# Patient Record
Sex: Female | Born: 1935 | Race: White | Hispanic: No | Marital: Married | Smoking: Never smoker
Health system: Southern US, Community
[De-identification: ages and names within clinical notes are randomized; demographics above are authoritative.]

## PROBLEM LIST (undated history)

## (undated) DIAGNOSIS — I10 Essential (primary) hypertension: Secondary | ICD-10-CM

## (undated) DIAGNOSIS — Z7189 Other specified counseling: Secondary | ICD-10-CM

## (undated) DIAGNOSIS — N189 Chronic kidney disease, unspecified: Secondary | ICD-10-CM

## (undated) HISTORY — PX: TONSILLECTOMY: SUR1361

## (undated) HISTORY — PX: HERNIA REPAIR: SHX51

## (undated) HISTORY — PX: ABDOMINAL HYSTERECTOMY: SHX81

## (undated) HISTORY — PX: APPENDECTOMY: SHX54

## (undated) HISTORY — PX: OTHER SURGICAL HISTORY: SHX169

---

## 2006-07-13 ENCOUNTER — Encounter (INDEPENDENT_AMBULATORY_CARE_PROVIDER_SITE_OTHER): Payer: Self-pay | Admitting: Emergency Medicine

## 2006-07-13 ENCOUNTER — Encounter (INDEPENDENT_AMBULATORY_CARE_PROVIDER_SITE_OTHER): Payer: Self-pay | Admitting: Hospitalist

## 2006-07-13 ENCOUNTER — Ambulatory Visit: Payer: Self-pay | Admitting: Cardiology

## 2006-07-13 ENCOUNTER — Inpatient Hospital Stay (HOSPITAL_COMMUNITY): Admission: EM | Admit: 2006-07-13 | Discharge: 2006-07-28 | Payer: Self-pay | Admitting: Emergency Medicine

## 2006-07-13 ENCOUNTER — Ambulatory Visit: Payer: Self-pay | Admitting: Hospitalist

## 2006-07-14 ENCOUNTER — Ambulatory Visit: Payer: Self-pay | Admitting: Vascular Surgery

## 2006-07-14 ENCOUNTER — Encounter (INDEPENDENT_AMBULATORY_CARE_PROVIDER_SITE_OTHER): Payer: Self-pay | Admitting: Hospitalist

## 2006-07-24 ENCOUNTER — Ambulatory Visit: Payer: Self-pay | Admitting: Physical Medicine & Rehabilitation

## 2006-07-28 ENCOUNTER — Ambulatory Visit: Payer: Self-pay | Admitting: Physical Medicine & Rehabilitation

## 2006-07-28 ENCOUNTER — Inpatient Hospital Stay (HOSPITAL_COMMUNITY)
Admission: RE | Admit: 2006-07-28 | Discharge: 2006-08-02 | Payer: Self-pay | Admitting: Physical Medicine & Rehabilitation

## 2006-08-02 ENCOUNTER — Inpatient Hospital Stay (HOSPITAL_COMMUNITY): Admission: AD | Admit: 2006-08-02 | Discharge: 2006-08-12 | Payer: Self-pay | Admitting: Internal Medicine

## 2006-08-02 ENCOUNTER — Ambulatory Visit: Payer: Self-pay | Admitting: Internal Medicine

## 2006-08-07 ENCOUNTER — Ambulatory Visit: Payer: Self-pay | Admitting: Infectious Diseases

## 2006-08-08 ENCOUNTER — Encounter (INDEPENDENT_AMBULATORY_CARE_PROVIDER_SITE_OTHER): Payer: Self-pay | Admitting: Internal Medicine

## 2006-08-14 ENCOUNTER — Telehealth (INDEPENDENT_AMBULATORY_CARE_PROVIDER_SITE_OTHER): Payer: Self-pay | Admitting: Internal Medicine

## 2006-08-15 ENCOUNTER — Telehealth (INDEPENDENT_AMBULATORY_CARE_PROVIDER_SITE_OTHER): Payer: Self-pay | Admitting: *Deleted

## 2006-08-28 ENCOUNTER — Ambulatory Visit: Payer: Self-pay | Admitting: *Deleted

## 2006-08-28 ENCOUNTER — Encounter (INDEPENDENT_AMBULATORY_CARE_PROVIDER_SITE_OTHER): Payer: Self-pay | Admitting: *Deleted

## 2006-08-28 DIAGNOSIS — N8111 Cystocele, midline: Secondary | ICD-10-CM

## 2006-08-28 DIAGNOSIS — R5381 Other malaise: Secondary | ICD-10-CM | POA: Insufficient documentation

## 2006-08-28 DIAGNOSIS — L299 Pruritus, unspecified: Secondary | ICD-10-CM | POA: Insufficient documentation

## 2006-08-28 DIAGNOSIS — M545 Low back pain: Secondary | ICD-10-CM

## 2006-08-28 DIAGNOSIS — N189 Chronic kidney disease, unspecified: Secondary | ICD-10-CM

## 2006-08-28 DIAGNOSIS — D631 Anemia in chronic kidney disease: Secondary | ICD-10-CM | POA: Insufficient documentation

## 2006-08-28 DIAGNOSIS — N139 Obstructive and reflux uropathy, unspecified: Secondary | ICD-10-CM | POA: Insufficient documentation

## 2006-08-28 DIAGNOSIS — N19 Unspecified kidney failure: Secondary | ICD-10-CM | POA: Insufficient documentation

## 2006-08-28 DIAGNOSIS — R197 Diarrhea, unspecified: Secondary | ICD-10-CM | POA: Insufficient documentation

## 2006-08-28 DIAGNOSIS — N1330 Unspecified hydronephrosis: Secondary | ICD-10-CM | POA: Insufficient documentation

## 2006-08-28 DIAGNOSIS — N179 Acute kidney failure, unspecified: Secondary | ICD-10-CM | POA: Insufficient documentation

## 2006-08-28 DIAGNOSIS — E162 Hypoglycemia, unspecified: Secondary | ICD-10-CM

## 2006-08-28 DIAGNOSIS — E875 Hyperkalemia: Secondary | ICD-10-CM

## 2006-08-28 LAB — CONVERTED CEMR LAB
AST: 19 units/L (ref 0–37)
Albumin: 3.5 g/dL (ref 3.5–5.2)
Alkaline Phosphatase: 114 units/L (ref 39–117)
BUN: 58 mg/dL — ABNORMAL HIGH (ref 6–23)
Basophils Absolute: 0 10*3/uL (ref 0.0–0.1)
Basophils Relative: 0 % (ref 0–1)
CO2: 20 meq/L (ref 19–32)
Calcium: 8.8 mg/dL (ref 8.4–10.5)
Chloride: 112 meq/L (ref 96–112)
Creatinine, Ser: 3.09 mg/dL — ABNORMAL HIGH (ref 0.40–1.20)
Eosinophils Relative: 3 % (ref 0–5)
Glucose, Bld: 99 mg/dL (ref 70–99)
Hemoglobin: 12.4 g/dL (ref 12.0–15.0)
Lymphocytes Relative: 20 % (ref 12–46)
MCHC: 32.2 g/dL (ref 30.0–36.0)
Monocytes Absolute: 0.7 10*3/uL (ref 0.2–0.7)
Neutro Abs: 8.1 10*3/uL — ABNORMAL HIGH (ref 1.7–7.7)
Platelets: 298 10*3/uL (ref 150–400)
RDW: 16.4 % — ABNORMAL HIGH (ref 11.5–14.0)
Total Bilirubin: 0.4 mg/dL (ref 0.3–1.2)

## 2006-09-06 ENCOUNTER — Encounter (INDEPENDENT_AMBULATORY_CARE_PROVIDER_SITE_OTHER): Payer: Self-pay | Admitting: *Deleted

## 2006-09-07 ENCOUNTER — Telehealth (INDEPENDENT_AMBULATORY_CARE_PROVIDER_SITE_OTHER): Payer: Self-pay | Admitting: *Deleted

## 2006-09-11 ENCOUNTER — Encounter (INDEPENDENT_AMBULATORY_CARE_PROVIDER_SITE_OTHER): Payer: Self-pay | Admitting: *Deleted

## 2006-09-29 ENCOUNTER — Telehealth: Payer: Self-pay | Admitting: *Deleted

## 2006-10-06 ENCOUNTER — Encounter (INDEPENDENT_AMBULATORY_CARE_PROVIDER_SITE_OTHER): Payer: Self-pay | Admitting: *Deleted

## 2006-10-16 ENCOUNTER — Encounter (HOSPITAL_COMMUNITY): Admission: RE | Admit: 2006-10-16 | Discharge: 2007-01-14 | Payer: Self-pay | Admitting: *Deleted

## 2006-11-07 ENCOUNTER — Telehealth: Payer: Self-pay | Admitting: *Deleted

## 2006-11-07 ENCOUNTER — Ambulatory Visit: Payer: Self-pay | Admitting: Hospitalist

## 2006-11-20 ENCOUNTER — Encounter (INDEPENDENT_AMBULATORY_CARE_PROVIDER_SITE_OTHER): Payer: Self-pay | Admitting: *Deleted

## 2006-11-22 ENCOUNTER — Encounter (INDEPENDENT_AMBULATORY_CARE_PROVIDER_SITE_OTHER): Payer: Self-pay | Admitting: *Deleted

## 2006-11-27 ENCOUNTER — Encounter: Payer: Self-pay | Admitting: Family Medicine

## 2006-11-29 ENCOUNTER — Encounter (INDEPENDENT_AMBULATORY_CARE_PROVIDER_SITE_OTHER): Payer: Self-pay | Admitting: *Deleted

## 2006-11-29 ENCOUNTER — Ambulatory Visit: Payer: Self-pay | Admitting: *Deleted

## 2006-11-29 ENCOUNTER — Ambulatory Visit (HOSPITAL_COMMUNITY): Admission: RE | Admit: 2006-11-29 | Discharge: 2006-11-29 | Payer: Self-pay | Admitting: *Deleted

## 2006-11-29 DIAGNOSIS — R300 Dysuria: Secondary | ICD-10-CM

## 2006-11-30 ENCOUNTER — Encounter (INDEPENDENT_AMBULATORY_CARE_PROVIDER_SITE_OTHER): Payer: Self-pay | Admitting: *Deleted

## 2006-11-30 DIAGNOSIS — N390 Urinary tract infection, site not specified: Secondary | ICD-10-CM | POA: Insufficient documentation

## 2006-11-30 DIAGNOSIS — N2581 Secondary hyperparathyroidism of renal origin: Secondary | ICD-10-CM | POA: Insufficient documentation

## 2006-11-30 LAB — CONVERTED CEMR LAB
Albumin: 4.5 g/dL (ref 3.5–5.2)
BUN: 41 mg/dL — ABNORMAL HIGH (ref 6–23)
Bilirubin Urine: NEGATIVE
CO2: 19 meq/L (ref 19–32)
Calcium, Total (PTH): 9.3 mg/dL (ref 8.4–10.5)
Calcium: 9.2 mg/dL (ref 8.4–10.5)
Creatinine, Ser: 2.56 mg/dL — ABNORMAL HIGH (ref 0.40–1.20)
Eosinophils Absolute: 0.1 10*3/uL (ref 0.0–0.7)
Eosinophils Relative: 2 % (ref 0–5)
Glucose, Bld: 90 mg/dL (ref 70–99)
HCT: 58.4 % — ABNORMAL HIGH (ref 36.0–46.0)
Hemoglobin: 17.8 g/dL — ABNORMAL HIGH (ref 12.0–15.0)
Ketones, ur: NEGATIVE mg/dL
Lymphocytes Relative: 24 % (ref 12–46)
Lymphs Abs: 1.8 10*3/uL (ref 0.7–3.3)
MCV: 99.7 fL (ref 78.0–100.0)
Monocytes Absolute: 0.6 10*3/uL (ref 0.2–0.7)
PTH: 181.6 pg/mL — ABNORMAL HIGH (ref 14.0–72.0)
Protein, ur: 30 mg/dL — AB
Urine Glucose: NEGATIVE mg/dL
WBC: 7.4 10*3/uL (ref 4.0–10.5)

## 2007-02-27 ENCOUNTER — Encounter (INDEPENDENT_AMBULATORY_CARE_PROVIDER_SITE_OTHER): Payer: Self-pay | Admitting: *Deleted

## 2007-06-28 ENCOUNTER — Ambulatory Visit: Payer: Self-pay | Admitting: *Deleted

## 2007-06-28 ENCOUNTER — Encounter (INDEPENDENT_AMBULATORY_CARE_PROVIDER_SITE_OTHER): Payer: Self-pay | Admitting: *Deleted

## 2007-06-28 DIAGNOSIS — R21 Rash and other nonspecific skin eruption: Secondary | ICD-10-CM | POA: Insufficient documentation

## 2007-06-28 LAB — CONVERTED CEMR LAB
BUN: 63 mg/dL — ABNORMAL HIGH (ref 6–23)
Calcium: 9.2 mg/dL (ref 8.4–10.5)
Phosphorus: 4.5 mg/dL (ref 2.3–4.6)
Potassium: 4.4 meq/L (ref 3.5–5.3)
Sodium: 141 meq/L (ref 135–145)

## 2007-10-11 ENCOUNTER — Encounter (INDEPENDENT_AMBULATORY_CARE_PROVIDER_SITE_OTHER): Payer: Self-pay | Admitting: *Deleted

## 2007-10-11 ENCOUNTER — Ambulatory Visit: Payer: Self-pay | Admitting: Internal Medicine

## 2007-10-11 DIAGNOSIS — R030 Elevated blood-pressure reading, without diagnosis of hypertension: Secondary | ICD-10-CM

## 2007-10-16 LAB — CONVERTED CEMR LAB
BUN: 61 mg/dL — ABNORMAL HIGH (ref 6–23)
CO2: 20 meq/L (ref 19–32)
Calcium: 9.7 mg/dL (ref 8.4–10.5)
Chloride: 107 meq/L (ref 96–112)
Creatinine, Ser: 2.62 mg/dL — ABNORMAL HIGH (ref 0.40–1.20)
Glucose, Bld: 88 mg/dL (ref 70–99)
HCT: 38.5 % (ref 36.0–46.0)
Hemoglobin: 12.1 g/dL (ref 12.0–15.0)
MCV: 97.2 fL (ref 78.0–100.0)
Platelets: 202 10*3/uL (ref 150–400)
RBC: 3.96 M/uL (ref 3.87–5.11)
WBC: 8.3 10*3/uL (ref 4.0–10.5)

## 2007-10-19 ENCOUNTER — Encounter (INDEPENDENT_AMBULATORY_CARE_PROVIDER_SITE_OTHER): Payer: Self-pay | Admitting: *Deleted

## 2007-10-19 ENCOUNTER — Ambulatory Visit: Payer: Self-pay | Admitting: Internal Medicine

## 2007-10-23 ENCOUNTER — Encounter (INDEPENDENT_AMBULATORY_CARE_PROVIDER_SITE_OTHER): Payer: Self-pay | Admitting: *Deleted

## 2007-10-23 LAB — CONVERTED CEMR LAB
BUN: 53 mg/dL — ABNORMAL HIGH (ref 6–23)
Calcium: 9 mg/dL (ref 8.4–10.5)
Creatinine, Ser: 2.65 mg/dL — ABNORMAL HIGH (ref 0.40–1.20)

## 2007-11-28 ENCOUNTER — Encounter (INDEPENDENT_AMBULATORY_CARE_PROVIDER_SITE_OTHER): Payer: Self-pay | Admitting: *Deleted

## 2008-04-03 ENCOUNTER — Encounter (INDEPENDENT_AMBULATORY_CARE_PROVIDER_SITE_OTHER): Payer: Self-pay | Admitting: *Deleted

## 2008-04-16 ENCOUNTER — Encounter (INDEPENDENT_AMBULATORY_CARE_PROVIDER_SITE_OTHER): Payer: Self-pay | Admitting: *Deleted

## 2008-06-04 ENCOUNTER — Ambulatory Visit: Payer: Self-pay | Admitting: Infectious Disease

## 2008-06-04 ENCOUNTER — Encounter (INDEPENDENT_AMBULATORY_CARE_PROVIDER_SITE_OTHER): Payer: Self-pay | Admitting: *Deleted

## 2008-06-04 DIAGNOSIS — G4700 Insomnia, unspecified: Secondary | ICD-10-CM | POA: Insufficient documentation

## 2008-06-04 DIAGNOSIS — I10 Essential (primary) hypertension: Secondary | ICD-10-CM | POA: Insufficient documentation

## 2008-06-04 DIAGNOSIS — K5730 Diverticulosis of large intestine without perforation or abscess without bleeding: Secondary | ICD-10-CM | POA: Insufficient documentation

## 2008-06-06 LAB — CONVERTED CEMR LAB
BUN: 53 mg/dL — ABNORMAL HIGH (ref 6–23)
Chloride: 109 meq/L (ref 96–112)
GFR calc Af Amer: 22 mL/min — ABNORMAL LOW (ref >60)
GFR calc non Af Amer: 18 mL/min — ABNORMAL LOW (ref >60)
HCT: 37 % (ref 36.0–46.0)
Hemoglobin: 11.9 g/dL — ABNORMAL LOW (ref 12.0–15.0)
Potassium: 4.5 meq/L (ref 3.5–5.3)
RDW: 12.9 % (ref 11.5–15.5)
Sodium: 143 meq/L (ref 135–145)
WBC: 7.8 10*3/uL (ref 4.0–10.5)

## 2008-06-12 ENCOUNTER — Ambulatory Visit: Payer: Self-pay | Admitting: *Deleted

## 2008-06-12 ENCOUNTER — Encounter (INDEPENDENT_AMBULATORY_CARE_PROVIDER_SITE_OTHER): Payer: Self-pay | Admitting: *Deleted

## 2008-06-12 LAB — CONVERTED CEMR LAB
BUN: 62 mg/dL — ABNORMAL HIGH (ref 6–23)
CO2: 20 meq/L (ref 19–32)
Chloride: 109 meq/L (ref 96–112)
Creatinine, Ser: 2.81 mg/dL — ABNORMAL HIGH (ref 0.40–1.20)
GFR calc non Af Amer: 17 mL/min — ABNORMAL LOW (ref >60)
Potassium: 4.6 meq/L (ref 3.5–5.3)

## 2008-06-26 ENCOUNTER — Encounter (INDEPENDENT_AMBULATORY_CARE_PROVIDER_SITE_OTHER): Payer: Self-pay | Admitting: *Deleted

## 2008-06-26 ENCOUNTER — Ambulatory Visit: Payer: Self-pay | Admitting: Internal Medicine

## 2008-06-27 ENCOUNTER — Telehealth (INDEPENDENT_AMBULATORY_CARE_PROVIDER_SITE_OTHER): Payer: Self-pay | Admitting: *Deleted

## 2008-06-27 LAB — CONVERTED CEMR LAB
Bilirubin Urine: NEGATIVE
CO2: 19 meq/L (ref 19–32)
Chloride: 108 meq/L (ref 96–112)
Creatinine, Ser: 2.67 mg/dL — ABNORMAL HIGH (ref 0.40–1.20)
Glucose, Bld: 87 mg/dL (ref 70–99)
RBC / HPF: NONE SEEN (ref <3)
Sodium: 140 meq/L (ref 135–145)
Urine Glucose: NEGATIVE mg/dL
pH: 5.5 (ref 5.0–8.0)

## 2008-07-09 ENCOUNTER — Ambulatory Visit: Payer: Self-pay | Admitting: Internal Medicine

## 2008-07-09 ENCOUNTER — Encounter (INDEPENDENT_AMBULATORY_CARE_PROVIDER_SITE_OTHER): Payer: Self-pay | Admitting: *Deleted

## 2008-07-09 LAB — CONVERTED CEMR LAB
Potassium: 5.5 meq/L — ABNORMAL HIGH (ref 3.5–5.3)
Sodium: 141 meq/L (ref 135–145)

## 2008-07-10 ENCOUNTER — Ambulatory Visit: Payer: Self-pay | Admitting: Internal Medicine

## 2008-07-10 ENCOUNTER — Encounter (INDEPENDENT_AMBULATORY_CARE_PROVIDER_SITE_OTHER): Payer: Self-pay | Admitting: *Deleted

## 2008-07-10 LAB — CONVERTED CEMR LAB
CO2: 23 meq/L (ref 19–32)
Calcium: 10.1 mg/dL (ref 8.4–10.5)
Creatinine, Ser: 2.83 mg/dL — ABNORMAL HIGH (ref 0.40–1.20)
Glucose, Bld: 96 mg/dL (ref 70–99)

## 2008-07-22 ENCOUNTER — Encounter (INDEPENDENT_AMBULATORY_CARE_PROVIDER_SITE_OTHER): Payer: Self-pay | Admitting: *Deleted

## 2008-07-22 ENCOUNTER — Ambulatory Visit: Payer: Self-pay | Admitting: Internal Medicine

## 2008-07-22 LAB — CONVERTED CEMR LAB
BUN: 49 mg/dL — ABNORMAL HIGH (ref 6–23)
CO2: 21 meq/L (ref 19–32)
Calcium: 9.3 mg/dL (ref 8.4–10.5)
GFR calc Af Amer: 24 mL/min — ABNORMAL LOW (ref >60)
Glucose, Bld: 88 mg/dL (ref 70–99)

## 2008-09-22 ENCOUNTER — Encounter (INDEPENDENT_AMBULATORY_CARE_PROVIDER_SITE_OTHER): Payer: Self-pay | Admitting: Dermatology

## 2008-11-28 IMAGING — CR DG CHEST 1V PORT
1 series · 1 of 1 positions shown · non-contrast
Comparison: 07/14/2006

CLINICAL DATA: Renal failure

PORTABLE CHEST - 1 VIEW:

[view not recorded]
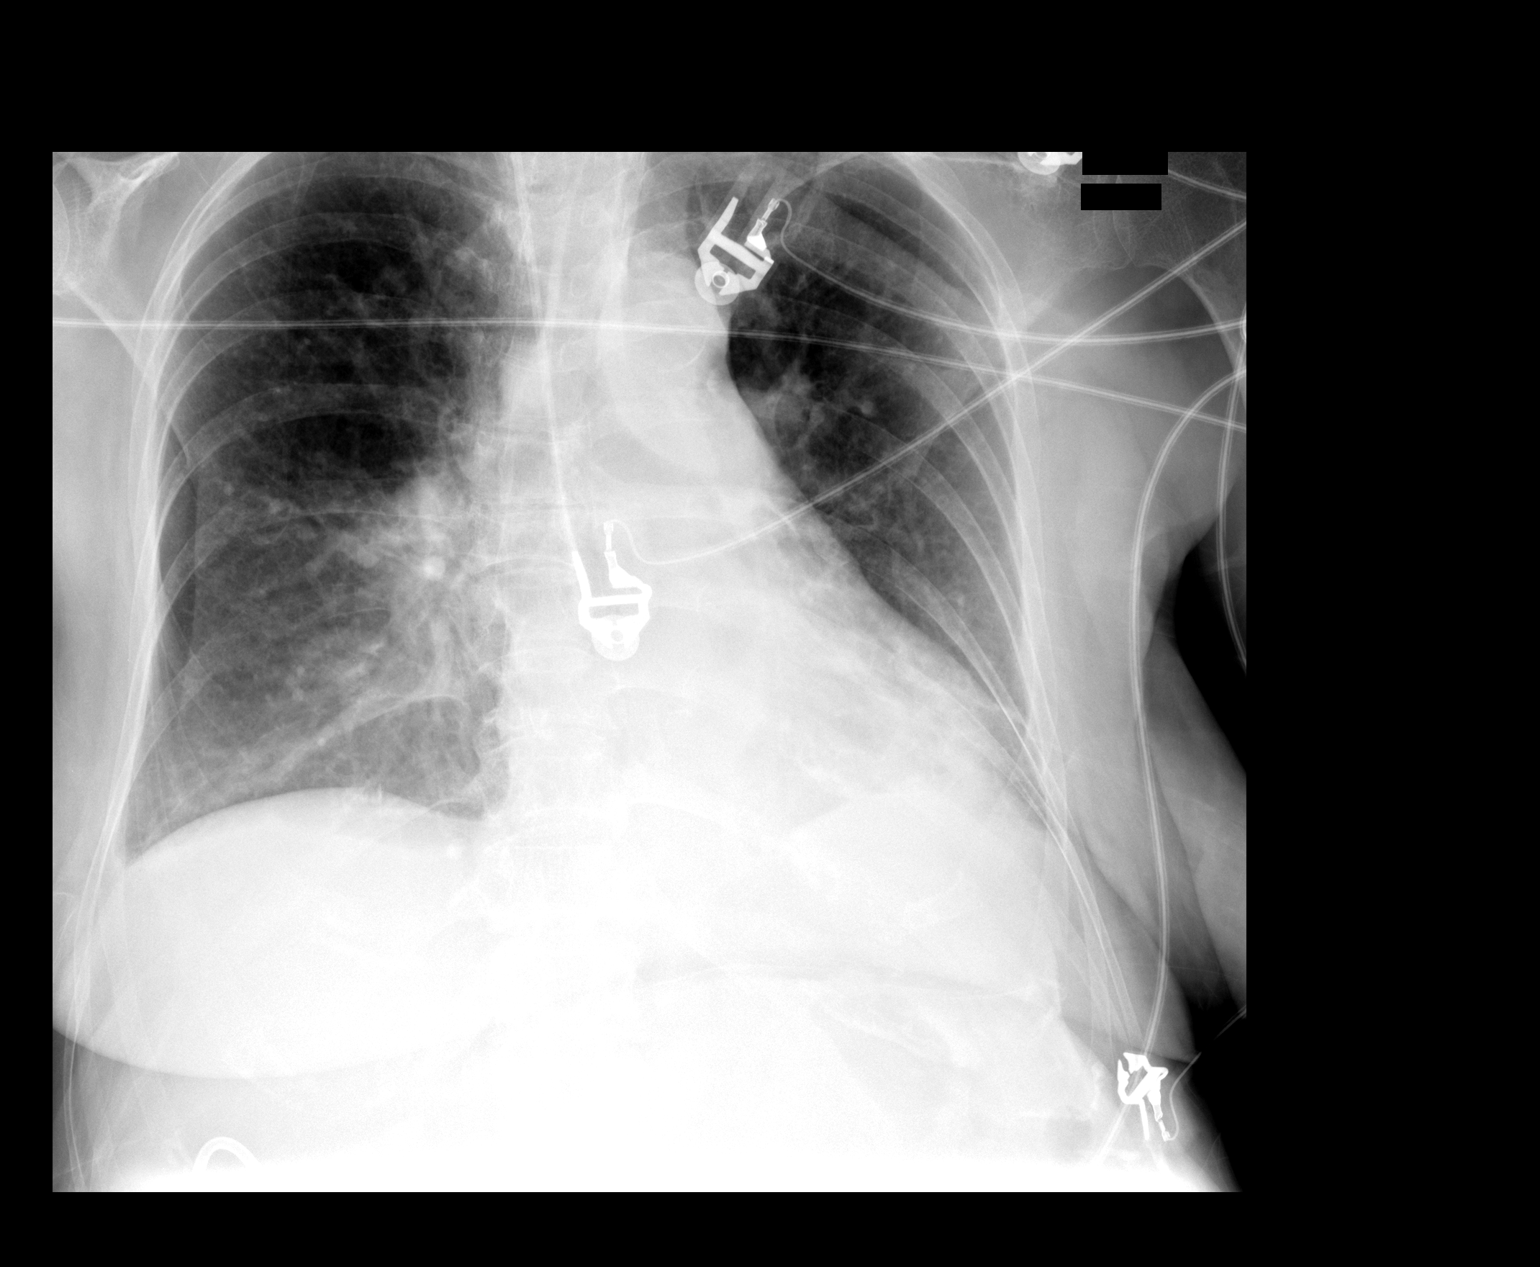

[1 of 1 positions shown; findings below may reference images not displayed]

FINDINGS: Heart is borderline enlarged. There is vascular congestion. Slight
increased bibasilar atelectasis.
IMPRESSION: Increasing bibasilar atelectasis.

## 2008-11-28 IMAGING — CR DG ABD PORTABLE 1V
1 series · 1 of 1 positions shown · non-contrast
Comparison: none

CLINICAL DATA: Pancreatitis

Portable abdomen at 945:
Comparison 07/13/2006. Bilateral nephrostomy tubes have been placed. Small bowel
decompressed. Normal distribution of gas and stool throughout the colon.
Visualized bones unremarkable. Right upper abdominal calcifications may
represent cholelithiasis.

[view not recorded]
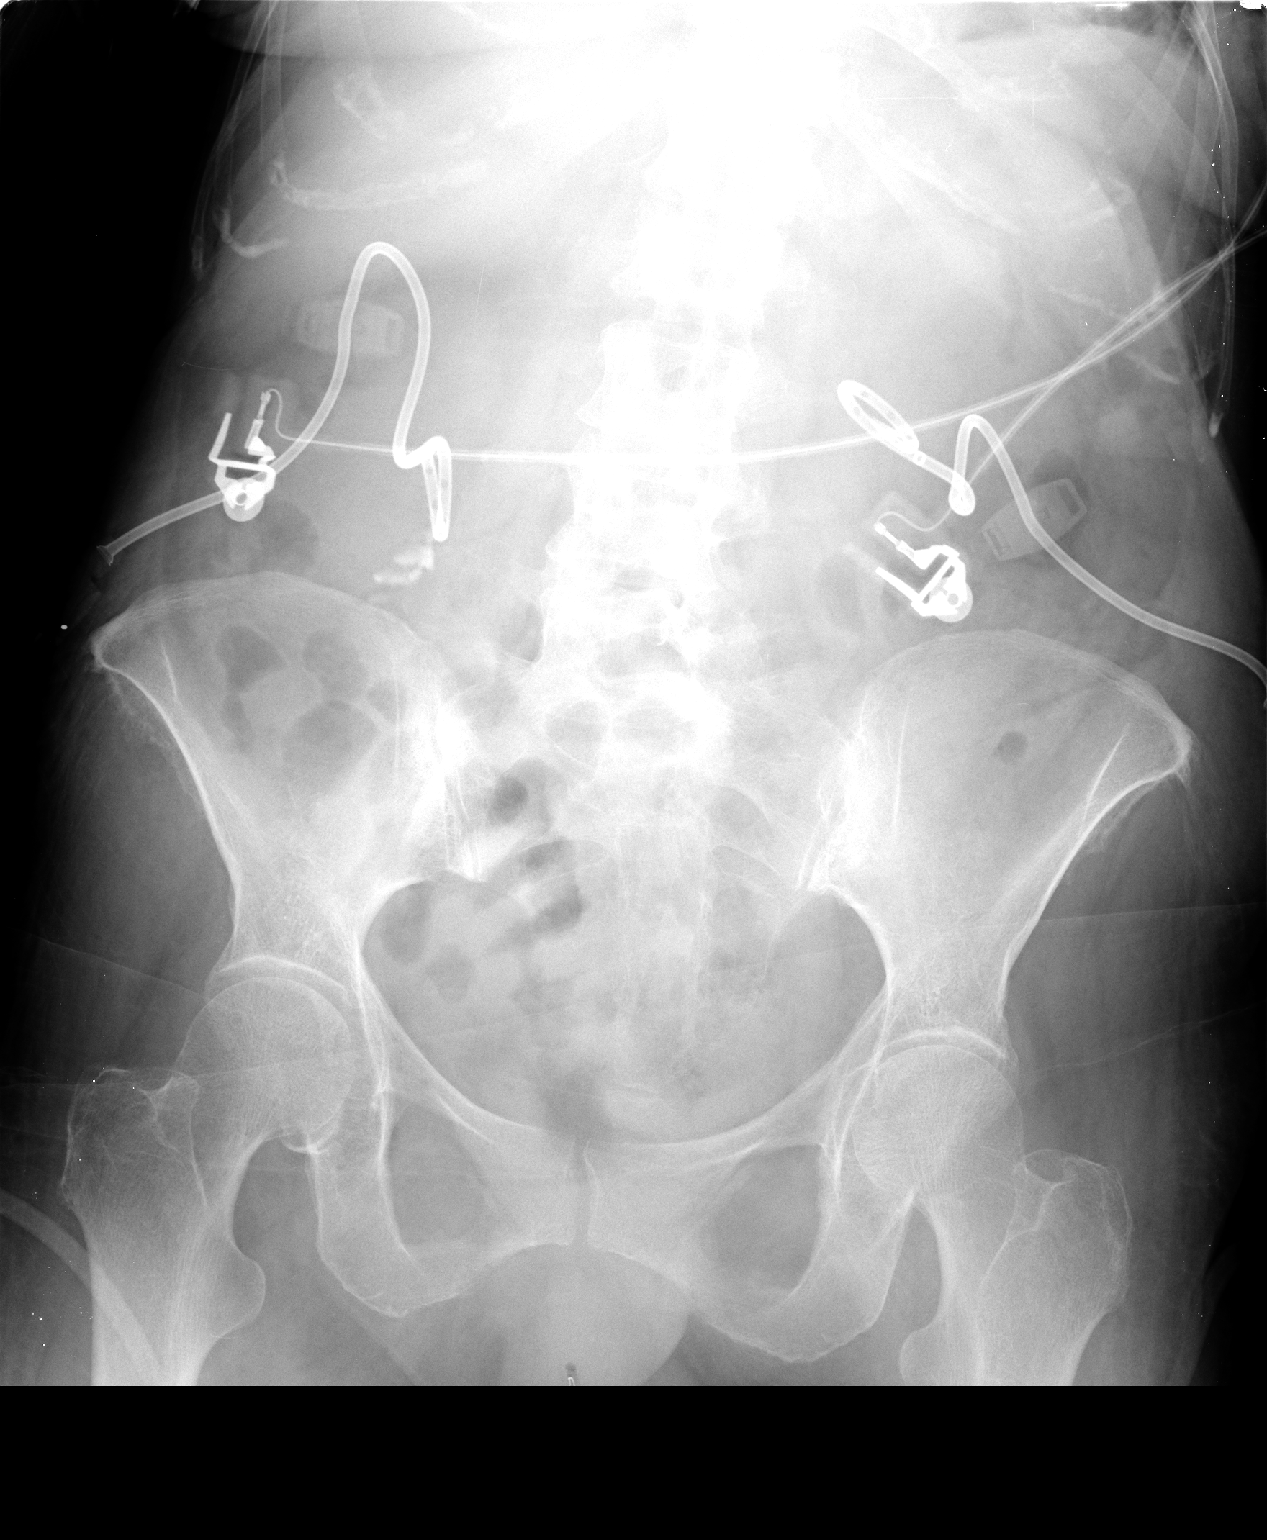

[1 of 1 positions shown; findings below may reference images not displayed]

IMPRESSION: 1. Normal bowel gas pattern.
2. Bilateral nephrostomy tubes project in expected location

## 2008-11-29 IMAGING — CT CT HEAD W/O CM
1 of 2 series · 13 of 30 positions shown, 17 images · IV contrast (agent unspecified)
Comparison: none

CLINICAL DATA: Having seizures.  Renal failure, dehydration.
 HEAD CT WITHOUT CONTRAST:
TECHNIQUE: Contiguous axial images were obtained from the base of the skull through the vertex according to standard protocol without contrast.

[Series 2: brain · axial · 0.47mm/px · z∈[+123,+247]mm · 13 of 28 slices shown, 17 images]
[im 2/28  brain]
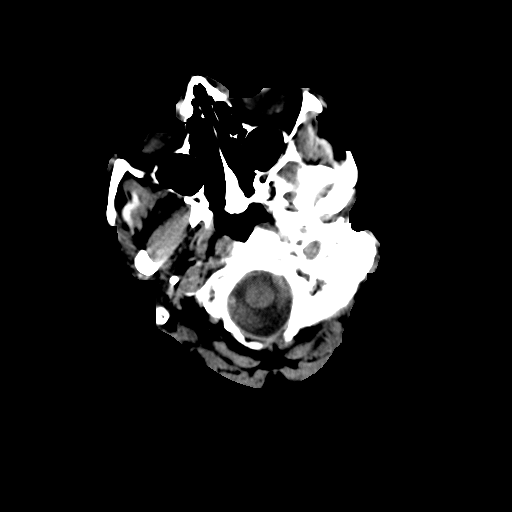
[im 2/28  bone]
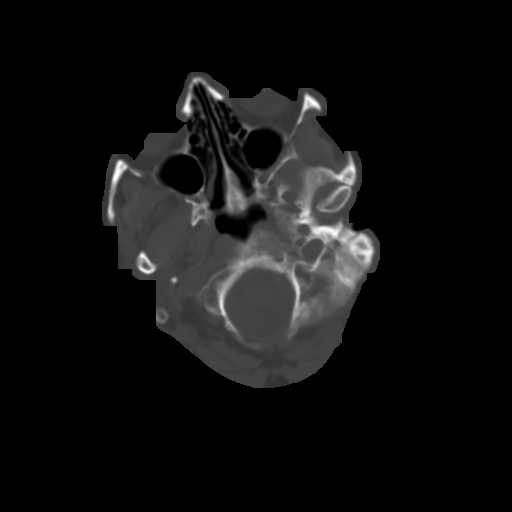
[im 4/28  brain]
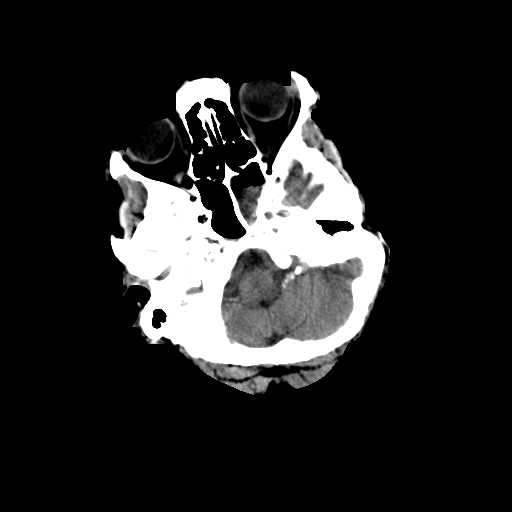
[im 6/28  brain]
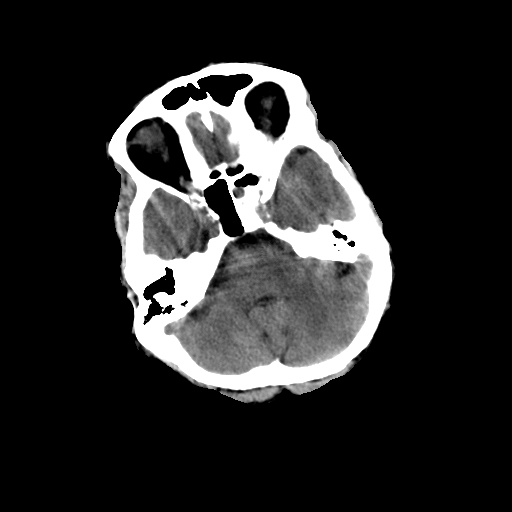
[im 8/28  brain]
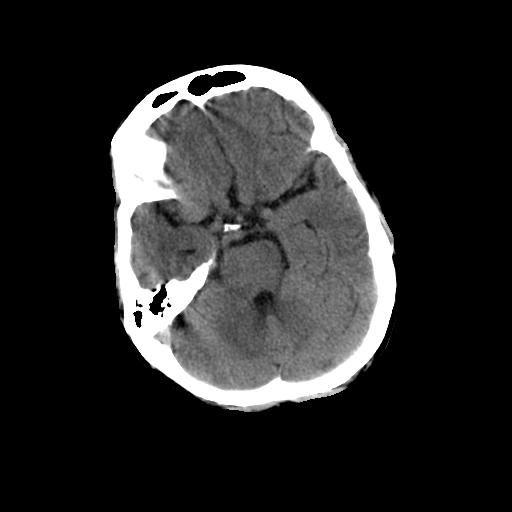
[im 10/28  brain]
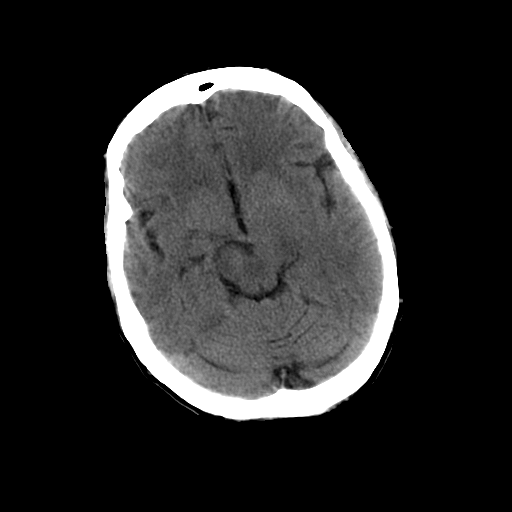
[im 10/28  bone]
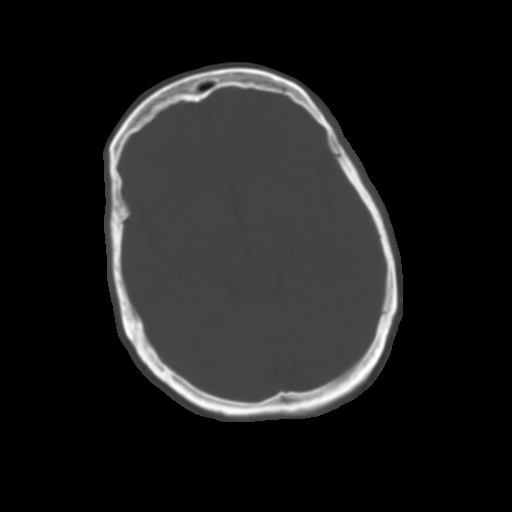
[im 12/28  brain]
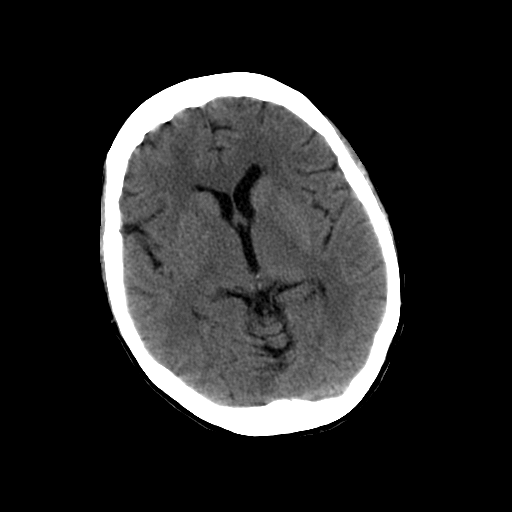
[im 14/28  brain]
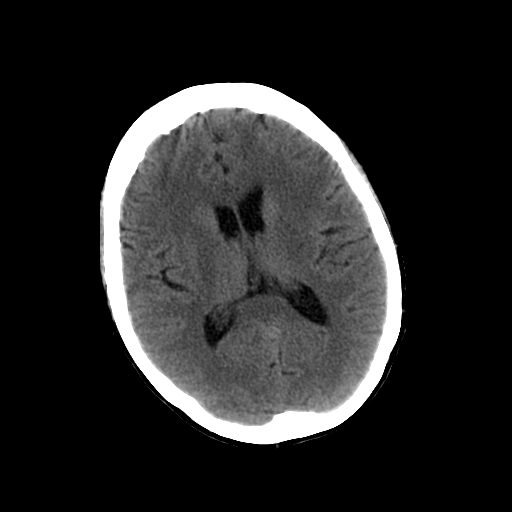
[im 16/28  brain]
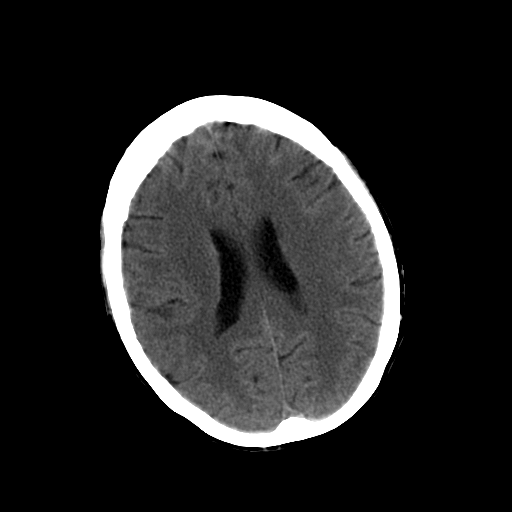
[im 18/28  brain]
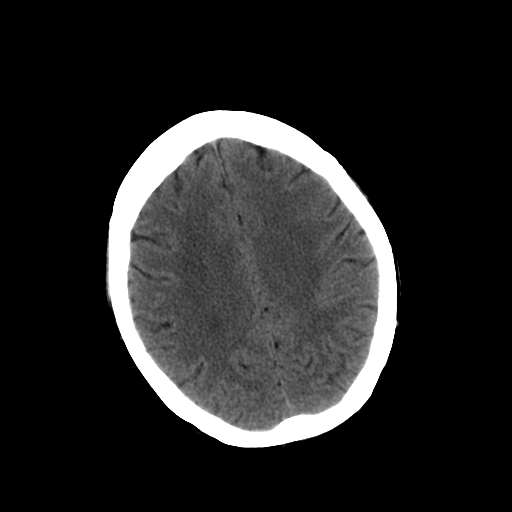
[im 18/28  bone]
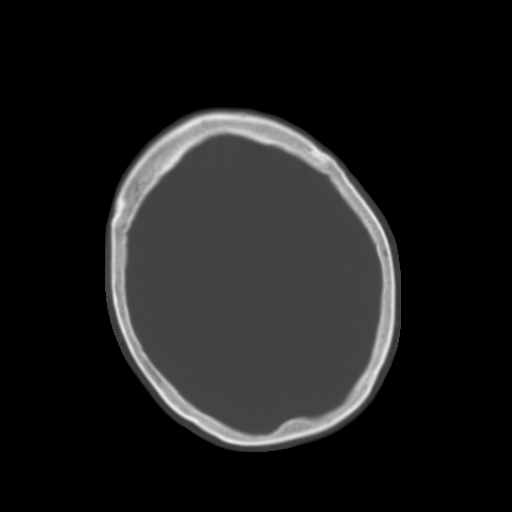
[im 20/28  brain]
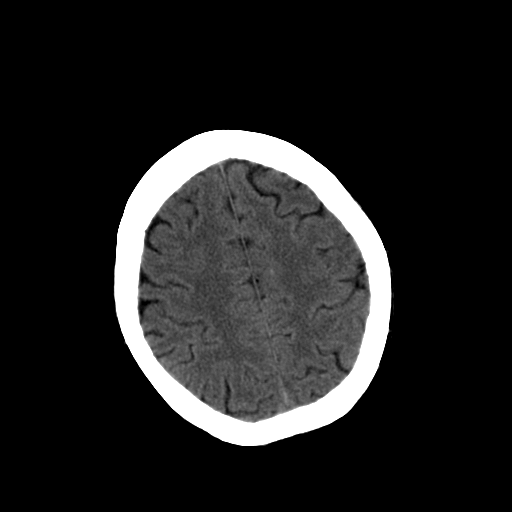
[im 22/28  brain]
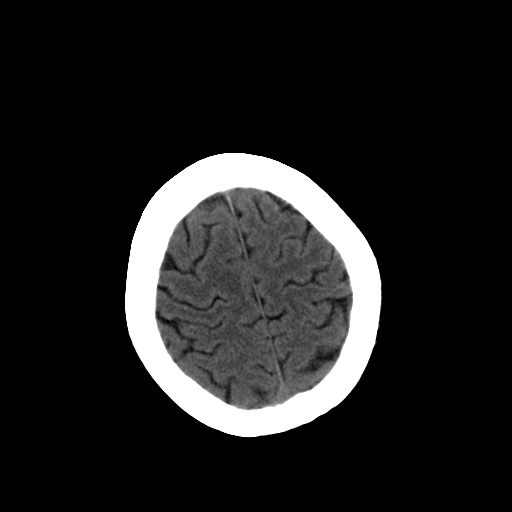
[im 24/28  brain]
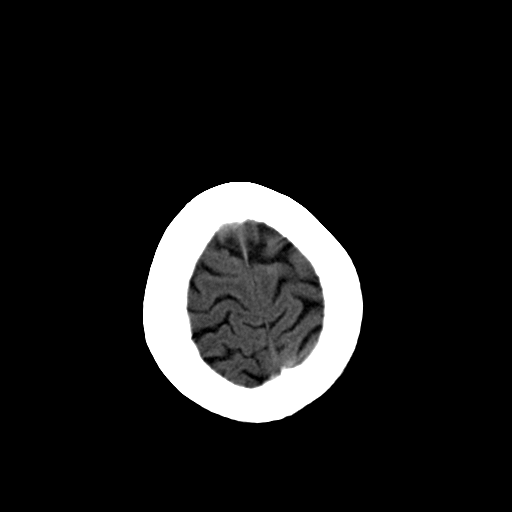
[im 26/28  brain]
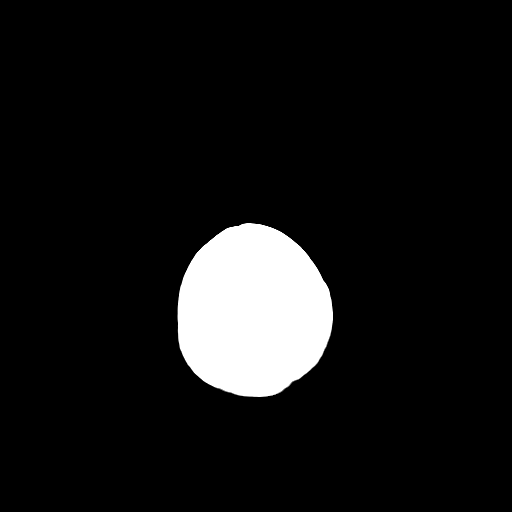
[im 26/28  bone]
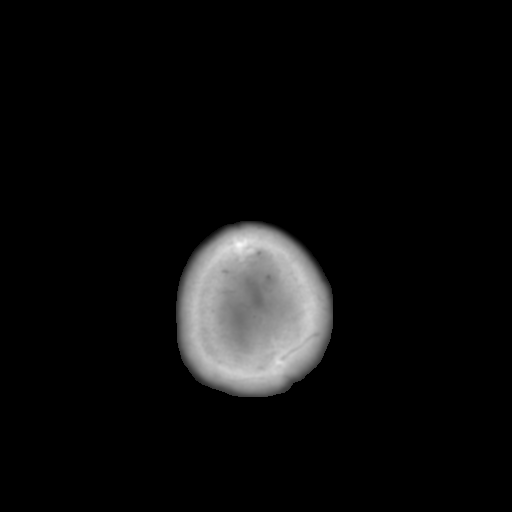

[13 of 30 positions shown; findings below may reference images not displayed]

FINDINGS: The ventricular system is normal in size and configuration, and the septum is in a normal midline position.  The 4th ventricle and basilar cisterns appear normal.  No blood, edema, or mass effect is seen.  The basilar artery is slightly fusiformly dilated ? of questionable significance.  On bone window images, there is evidence of sphenoid sinusitis.  No bony abnormality is seen.
IMPRESSION: 1.  No acute intracranial abnormality. 
 2.  Sphenoid sinusitis.

## 2008-12-12 ENCOUNTER — Ambulatory Visit: Payer: Self-pay | Admitting: Family Medicine

## 2008-12-18 ENCOUNTER — Encounter: Payer: Self-pay | Admitting: Family Medicine

## 2008-12-24 ENCOUNTER — Encounter (INDEPENDENT_AMBULATORY_CARE_PROVIDER_SITE_OTHER): Payer: Self-pay | Admitting: *Deleted

## 2008-12-24 ENCOUNTER — Encounter: Payer: Self-pay | Admitting: Family Medicine

## 2009-01-12 ENCOUNTER — Ambulatory Visit: Payer: Self-pay | Admitting: Family Medicine

## 2009-06-10 ENCOUNTER — Encounter: Payer: Self-pay | Admitting: Family Medicine

## 2009-06-23 ENCOUNTER — Encounter: Payer: Self-pay | Admitting: Family Medicine

## 2009-07-13 ENCOUNTER — Encounter: Admission: RE | Admit: 2009-07-13 | Discharge: 2009-07-13 | Payer: Self-pay | Admitting: General Surgery

## 2009-07-23 ENCOUNTER — Encounter: Payer: Self-pay | Admitting: Family Medicine

## 2009-08-27 ENCOUNTER — Observation Stay (HOSPITAL_COMMUNITY): Admission: RE | Admit: 2009-08-27 | Discharge: 2009-08-28 | Payer: Self-pay | Admitting: General Surgery

## 2009-09-10 ENCOUNTER — Encounter: Payer: Self-pay | Admitting: Family Medicine

## 2009-09-21 ENCOUNTER — Encounter: Payer: Self-pay | Admitting: Family Medicine

## 2010-02-21 ENCOUNTER — Encounter: Payer: Self-pay | Admitting: *Deleted

## 2010-03-02 NOTE — Letter (Signed)
Summary: [facility]  [facility]   Imported By: Maryln Gottron 07/13/2009 14:32:10  _____________________________________________________________________  External Attachment:    Type:   Image     Comment:   External Document

## 2010-03-02 NOTE — Letter (Signed)
Summary: [facility]   Imported By: Maryln Gottron 07/13/2009 14:33:19  _____________________________________________________________________  External Attachment:    Type:   Image     Comment:   External Document

## 2010-03-02 NOTE — Letter (Signed)
Summary: [facility]   Imported By: Maryln Gottron 08/10/2009 15:22:27  _____________________________________________________________________  External Attachment:    Type:   Image     Comment:   External Document

## 2010-03-02 NOTE — Letter (Signed)
Summary: [facility]  [facility]   Imported By: Maryln Gottron 10/19/2009 10:41:37  _____________________________________________________________________  External Attachment:    Type:   Image     Comment:   External Document

## 2010-03-02 NOTE — Assessment & Plan Note (Signed)
Summary: 2WK F/U/EST/VS   Vital Signs:  Patient profile:   76 year old female Height:      58.9 inches (149.61 cm) Weight:      123.9 pounds (56.45 kg) BMI:     25.26 Temp:     97.3 degrees F (36.28 degrees C) oral Pulse rate:   71 / minute BP sitting:   157 / 76  (left arm) Cuff size:   regular  Vitals Entered By: Theotis Barrio NT II (July 22, 2008 3:56 PM) CC: RIGHT KNEE PAIN -AT NIGHT #8  /  HEADACHE #2-5 /  Is Patient Diabetic? No Pain Assessment Patient in pain? yes     Location: RIGHT KNEE Nutritional Status BMI of 25 - 29 = overweight  Have you ever been in a relationship where you felt threatened, hurt or afraid?No   Does patient need assistance? Functional Status Self care Ambulation Normal Comments MEDICATION (NORVASC-GENERIC) CAUSED HER TO BE DISOREINTED/ CRYING / ITCHING / TINGLING IN FINGER AND TOES. PATIENT STATED SHE STOPPED TAKING THE MED. /  LEFT KNEE PAIN -AND SWELLING  AND HEADACHE. /. FOLLOWUP APPT.   CC:  RIGHT KNEE PAIN -AT NIGHT #8  /  HEADACHE #2-5 / .  History of Present Illness: Pt is a 76 year old woman with PMH as outlined in note who presents to clinic for blood pressure check after starting norvasc 5mg  daily.  She reports that she took one dose of the medication and experienced several side effects which included knee pain, tingling in fingers, legs jumpy, crying, disoriented, dizzy and a rash. Pt has had multiple reactions to different medications.  I wonder if this is actually a component of anxiety based on her prior problems with various medications.  I will stop the norvasc although I am not sure the medication is responsible for her symtoms which were similar after taking other medications.    Preventive Screening-Counseling & Management  Alcohol-Tobacco     Smoking Status: never  Caffeine-Diet-Exercise     Does Patient Exercise: WALK AROUND THE YARD     Type of exercise:    3  Problems Prior to Update: 1)  Insomnia  (ICD-780.52) 2)   Diverticulosis, Asymptomatic  (ICD-562.10) 3)  Hypertension  (ICD-401.9) 4)  Elevated Blood Pressure  (ICD-796.2) 5)  Preventive Health Care  (ICD-V70.0) 6)  Rash and Other Nonspecific Skin Eruption  (ICD-782.1) 7)  Uti  (ICD-599.0) 8)  Hyperparathyroidism, Secondary  (ICD-588.81) 9)  Dysuria  (ICD-788.1) 10)  Low Back Pain  (ICD-724.2) 11)  Renal Failure  (ICD-586) 12)  Diarrhea  (ICD-787.91) 13)  Prolapse, Vaginal Wall, Cystocele, Midline  (ICD-618.01) 14)  Obstruction, Urinary Nos  (ICD-599.60) 15)  Anemia in Chronic Kidney Disease  (ICD-285.21) 16)  Debility Nos  (ICD-799.3) 17)  Hydronephrosis  (ICD-591) 18)  Pruritic Disorder Nos  (ICD-698.9) 19)  Lumbago  (ICD-724.2) 20)  Hypoglycemia Nos  (ICD-251.2) 21)  Renal Failure, Acute  (ICD-584.9) 22)  Hyperpotassemia  (ICD-276.7)  Medications Prior to Update: 1)  Thiamine Hcl 100 Mg/ml  Soln (Thiamine Hcl) .... Take 1 Tablet By Mouth Once A Day 2)  Folic Acid 1 Mg  Tabs (Folic Acid) .... Take 1 Tablet By Mouth Once A Day 3)  Premarin 0.625 Mg/gm  Crea (Estrogens, Conjugated) .... Apply 1/4 of An Applicator Per Vagina Once A Week 4)  Ambien 5 Mg Tabs (Zolpidem Tartrate) .... Take One Pill At Night As Needed For Insomnia. 5)  Fluconazole 150 Mg Tabs (Fluconazole) .... Take  One Pill 6)  Norvasc 5 Mg Tabs (Amlodipine Besylate) .... Take 1 Tablet By Mouth Once A Day  Current Medications (verified): 1)  Thiamine Hcl 100 Mg/ml  Soln (Thiamine Hcl) .... Take 1 Tablet By Mouth Once A Day 2)  Folic Acid 1 Mg  Tabs (Folic Acid) .... Take 1 Tablet By Mouth Once A Day 3)  Premarin 0.625 Mg/gm  Crea (Estrogens, Conjugated) .... Apply 1/4 of An Applicator Per Vagina Once A Week 4)  Ambien 5 Mg Tabs (Zolpidem Tartrate) .... Take One Pill At Night As Needed For Insomnia. 5)  Fluconazole 150 Mg Tabs (Fluconazole) .... Take One Pill 6)  Hydralazine Hcl 10 Mg Tabs (Hydralazine Hcl) .... Take One Pill Every 8 Hours  Allergies: 1)  ! Penicillin 2)   ! Amoxicillin 3)  ! Prilosec 4)  ! Ibuprofen 5)  ! * Orudis 6)  ! Zantac 7)  ! Claritin 8)  ! * Allegra 9)  ! * Endal-Hd 10)  ! * Tylenol Flu 11)  ! * Tessilon Perles 12)  ! * Histenox-Hc 13)  ! Lisinopril (Lisinopril) 14)  ! Norvasc (Amlodipine Besylate)  Past History:  Past Medical History: Last updated: 08/28/2006 Pyelonephritis secondary to obstruction/hydronephrosis secondary to bladder and uterine prolapse.   -Acute renal failure likely secondary to above. -Seizure during hospitalization likely secondary to hypocalcemia +/- medications -Insomnia -itching likely secondary to uremia -hypomagnesiumemia -hyperkalemia -anemia of chronic disease -thrombocytopenia -compression fracture in thoracic vertebra Renal failure Low back pain  Family History: Last updated: 08/28/2006 Sister with history of colon cancer, bladder cancer, and endometrial cancer.   Social History: Last updated: 08/28/2006 Married Alcohol use-no Drug use-no Regular exercise-no  Risk Factors: Exercise: WALK AROUND THE YARD (07/22/2008)  Risk Factors: Smoking Status: never (07/22/2008)  Review of Systems       See HPI  Physical Exam  General:  alert, well-developed, well-nourished, and well-hydrated.   Head:  normocephalic and atraumatic.   Mouth:  pharynx pink and moist.   Lungs:  normal respiratory effort, no intercostal retractions, no accessory muscle use, normal breath sounds, no crackles, and no wheezes.   Heart:  normal rate, regular rhythm, no murmur, no gallop, and no rub.   Abdomen:  soft, non-tender, and normal bowel sounds.   Extremities:  No clubbing, cyanosis, edema, or deformity noted with normal full range of motion of all joints.   Neurologic:  alert & oriented X3.   Skin:  color normal, no rashes, and no suspicious lesions.   Psych:  Oriented X3, normally interactive, good eye contact, not anxious appearing, and not depressed appearing.     Impression &  Recommendations:  Problem # 1:  HYPERTENSION (ICD-401.9) Will stop the norvasc.  Pt is going to [place] on the 24th and will return on July 2nd and does not want to start another medication due to fear of side effects.  She is to try hydralazine 10mg  q 8 hours and will be careful given renal disease.  Will have pt follow up in 4 weeks to recheck her blood pressure.  Will check a BMET today as well.  The following medications were removed from the medication list:    Norvasc 5 Mg Tabs (Amlodipine besylate) .Marland Kitchen... Take 1 tablet by mouth once a day Her updated medication list for this problem includes:    Hydralazine Hcl 10 Mg Tabs (Hydralazine hcl) .Marland Kitchen... Take one pill every 8 hours  Orders: T-Basic Metabolic Panel [phone])  Complete Medication List: 1)  Thiamine  Hcl 100 Mg/ml Soln (Thiamine hcl) .... Take 1 tablet by mouth once a day 2)  Folic Acid 1 Mg Tabs (Folic acid) .... Take 1 tablet by mouth once a day 3)  Premarin 0.625 Mg/gm Crea (Estrogens, conjugated) .... Apply 1/4 of an applicator per vagina once a week 4)  Ambien 5 Mg Tabs (Zolpidem tartrate) .... Take one pill at night as needed for insomnia. 5)  Fluconazole 150 Mg Tabs (Fluconazole) .... Take one pill 6)  Hydralazine Hcl 10 Mg Tabs (Hydralazine hcl) .... Take one pill every 8 hours  Patient Instructions: 1)  Please schedule a follow-up appointment in 1 month. 2)  Start taking the hydralazine after you get back from [place].  Call if you have any side effects from the medication.   3)  Limit your Sodium (Salt). Prescriptions: HYDRALAZINE HCL 10 MG TABS (HYDRALAZINE HCL) take one pill every 8 hours  #90 x 1   Entered and Authorized by:   Rufina Falco MD   Signed by:   Rufina Falco MD on 07/22/2008   Method used:   Print then Give to Patient   RxID:   [id number]

## 2010-03-02 NOTE — Letter (Signed)
Summary: [facility]  [facility]   Imported By: Maryln Gottron 07/13/2009 14:34:55  _____________________________________________________________________  External Attachment:    Type:   Image     Comment:   External Document

## 2010-04-17 LAB — CBC
MCH: 31.8 pg (ref 26.0–34.0)
MCHC: 33.9 g/dL (ref 30.0–36.0)
MCV: 93.7 fL (ref 78.0–100.0)
Platelets: 218 10*3/uL (ref 150–400)
RBC: 3.7 MIL/uL — ABNORMAL LOW (ref 3.87–5.11)

## 2010-04-17 LAB — DIFFERENTIAL
Eosinophils Absolute: 0.2 10*3/uL (ref 0.0–0.7)
Eosinophils Relative: 2 % (ref 0–5)
Lymphs Abs: 1.7 10*3/uL (ref 0.7–4.0)
Monocytes Relative: 7 % (ref 3–12)

## 2010-04-17 LAB — BASIC METABOLIC PANEL
BUN: 41 mg/dL — ABNORMAL HIGH (ref 6–23)
CO2: 22 mEq/L (ref 19–32)
CO2: 22 mEq/L (ref 19–32)
Chloride: 107 mEq/L (ref 96–112)
Chloride: 110 mEq/L (ref 96–112)
Creatinine, Ser: 2.23 mg/dL — ABNORMAL HIGH (ref 0.4–1.2)
Creatinine, Ser: 2.45 mg/dL — ABNORMAL HIGH (ref 0.4–1.2)
GFR calc Af Amer: 23 mL/min — ABNORMAL LOW (ref >60)
Glucose, Bld: 94 mg/dL (ref 70–99)

## 2010-06-15 NOTE — H&P (Signed)
NAMEALYSSAH, ALGEO               ACCOUNT NO.:  [id number]   MEDICAL RECORD NO.:  [id number]          PATIENT TYPE:  IPS   LOCATION:  4002                         FACILITY:  [facility]   PHYSICIAN:  Ranelle Oyster, M.D.DATE OF BIRTH:  [birth year]   DATE OF ADMISSION:  07/28/2006  DATE OF DISCHARGE:                              HISTORY & PHYSICAL   CHIEF COMPLAINT:  Deconditioning.   HISTORY OF PRESENT ILLNESS:  A 76 year old white female admitted on June  12 with weakness and lethargy as well as vaginal bleeding.  She had BUN  and creatinine of 185 and 9, respectively, with past potassium 5.8.  Renal saw the patient too and diagnosed obstructive uropathy.  CT of the  abdomen was positive for bilateral hydronephrosis.  The patient also had  findings for prolapsed uterus, and bilateral nephrostomy tubes were  placed.  The patient was placed on T&A for nutritional support.  She was  cleared for a regular diet by modified barium swallow.  Generally the  patient's creatinine has improved with serial monitoring of renal  function.  She had a pessary device placed on June 25 for prolapsed  uterus.  Plan is to keep the Foley catheter in for now.  Nephrostomy  tubes tentatively will be removed June 28.  Both tubes are presently  capped.   REVIEW OF SYSTEMS:  Patient reports reflux, itching at times.  She has  had some insomnia.  Appetite has been poor.  She describes low back  pain.  Other pertinent positives listed above.  Full review is in the  written history and written H&P.   PAST MEDICAL HISTORY:  Unremarkable.   HABITS:  She denies alcohol or tobacco use.   FAMILY HISTORY:  Noncontributory.   SOCIAL HISTORY:  The patient lives with her husband who can assist as  needed.  Daughter also can assist.   FUNCTIONAL HISTORY:  The patient was independent prior to arrival.  Currently she needs min to mod assist for basic mobility and self-care.   ALLERGIES:  PENICILLIN, AMOXICILLIN,  ZANTAC, IBUPROFEN, and TYLENOL.   MEDICATIONS:  Prior to arrival are none.   LABORATORY DATA:  Hemoglobin 9, white count 8.1, platelets 354,000.  Sodium 141, potassium 4.8, BUN and creatinine 35 and 3.23.   PHYSICAL EXAMINATION:  VITAL SIGNS:  Blood pressure 146/66, pulse 86,  respiratory rate 20, temperature 98.3.  GENERAL:  Patient is pleasant, alert, and oriented x3.  Affect is bright  and appropriate.  HEENT:  Unremarkable with fair dentition, pink and moist oral mucosa.  NECK:  Supple without JVD or lymphadenopathy.  CHEST:  Clear to auscultation except for some decreased sounds at the  bases.  HEART:  Regular rate and rhythm without murmur, rubs or gallops.  EXTREMITIES:  Showed no clubbing, cyanosis, only trace edema distally.  ABDOMEN:  Soft, nontender.  BACK:  Notable for nephrostomy tubes which were capped; otherwise,  appeared appropriate at contact points with the skin, etc.  No drainage  was seen.  NEUROLOGICAL:  Cranial nerves II-XII are intact.  Reflexes are 1+ and  2+.  Sensation was  grossly normal.  Judgment, orientation, memory, and  mood were all within normal limits.  Motor function generally is 4 to  4+/5 in upper extremities.  She was 3+ to 4 proximally in the lower  extremities to 4/5 distally.   ASSESSMENT/PLAN:  1. Functional deficits secondary to deconditioning after obstructive      uropathy and acute renal failure.  The patient is to begin      comprehensive inpatient rehabilitation today with physical therapy      to assess and treat range of motion strengthening, gait, and      endurance.  Occupational therapy will assess for range of motion      strengthening, ADLs, and equipment.  Rehabilitation nurse will      follow for bowel, bladder, skin, and medication issues.      Rehabilitation case manager/social worker to assess for      psychosocial needs and discharge planning.  Estimated length of      stay is 7-10 days.  Prognosis good.  Goals  modified independent.  2. Prolapsed uterus:  Status post pessary device.  Continue Foley      catheter for now.  3. Renal failure:  Renal status is stabilized.  Potential removal of      nephrostomy tubes this weekend.  Check serial BUN and creatinine.  4. Anemia:  Continue Aranesp.  5. Nutrition:  Push p.o. intake and monitor weights.      Ranelle Oyster, M.D.  Electronically Signed     ZTS/MEDQ  D:  07/28/2006  T:  07/28/2006  Job:  [id number]

## 2010-06-15 NOTE — Discharge Summary (Signed)
NAMETEXAS, OBORN               ACCOUNT NO.:  [id number]   MEDICAL RECORD NO.:  [id number]          PATIENT TYPE:  INP   LOCATION:  5742                         FACILITY:  [facility]   PHYSICIAN:  Rufina Falco, M.D.     DATE OF BIRTH:  1934-12-23   DATE OF ADMISSION:  07/13/2006  DATE OF DISCHARGE:  07/28/2006                               DISCHARGE SUMMARY   DISCHARGE DIAGNOSES:  Are as follows:  1. Pyelonephritis secondary to obstruction/hydronephrosis, likely      secondary to bladder and uterine prolapse.  2. Acute renal failure, likely secondary to #1.  3. Diarrhea.  4. Seizure.  5. Itching.  6. Insomnia.  7. Anemia, likely anemia of chronic disease +/- iron deficiency anemia      secondary to GI losses.  8. Hypomagnesemia.  9. Altered mental status.  10.Deconditioning.  11.Thrombocytopenia.  12.Leukocytosis.  13.Back pain secondary to a mild compression fracture in the thoracic      vertebra.   DISCHARGE MEDICATIONS:  Patient will be transferred to inpatient rehab  on the following medications:  1. Ciprofloxacin.  2. Aranesp.  3. Ensure.  4. Folic acid.  5. Magnesium oxide.  6. Protonix.  7. Thiamine.  8. Albuterol.  9. Colace.  10.Vicodin.  11.Hydralazine.  12.Zofran.   DISPOSITION AND FOLLOWUP:  Patient will follow up with Dr. Maple Hudson in the  outpatient clinic.  She will also follow up with [facility] to have a  screening colonoscopy.  The dates for these appointments are on the new  hospital chart.  Patient will have a nurse visit prior to her  colonoscopy which, I believe, is on July 16 and she will follow up with  Dr. Maple Hudson in the outpatient clinic, I believe, on July 17.  Patient will  also need GYN followup after she leaves inpatient rehab for question of  hysterectomy and further management for her uterine and bladder  prolapse.   PROCEDURES PERFORMED WERE NUMEROUS AND INCLUDE:  1. Patient had a CT of the abdomen on July 13, 2006.  Impression:  1)   Bilateral hydroureteronephrosis with no obstructing lesion      visualized.  Question whether the patient had pyelonephritis with      associated caliectasis.  2) Gallstones.  2. Patient also had a CT of the pelvis without contrast on July 13, 2006.  Impression:  Small amount of fluid in the vaginal vault is a      nonspecific finding.  There is no evidence of neoplastic process on      this non-enhanced study.  3. Patient also had a chest x-ray on July 13, 2006.  Impression:  1)      No acute cardiopulmonary disease.  2) Osteopenia with probable      mild, mid-thoracic compression fraction.  3) Cardiomegaly without      congestive heart failure.  4. Patient had acute abdominal series on July 13, 2006.  Impression:      Findings compatible with cholelithiasis.  Consider abdominal      ultrasound for further assessment.  5.  Patient had an abdominal ultrasound on July 13, 2006.  Impression:      Cholelithiasis.  Positive ultrasound Murphy sign.  No findings to      strongly suggest gallbladder wall edema or free pericholecystic      fluid.  Mild biliary ductal dilatation.  Bilateral hydronephrosis.      Small kidneys with a normal increased echogenicity compatible with      nonspecific renal medical disease.  6. Patient also had chest x-rays on June 13 and 14.  7. Patient had abdominal ultrasound on July 15, 2006.  Impression:  1)      Normal bowel gas pattern.  2) Bilateral nephrostomy tubes project      in expected location.  8. Patient had ultrasound-guided bilateral nephrostomy tubes placed on      July 13, 2006.  9. Patient had bilateral nephrostograms done on July 13, 2006.      Impression:  Successful bilateral nephrostomy.  Bilateral ureters      are  patent, but there is a very low insertion of the ureters into      the bladder which may be due to bladder prolapse.  10.Patient had a chest x-ray on July 16, 2006.  Impression:  1)      Cardiomegaly, mild congestion and  question of small effusions.  2)      Right central venous catheter tip in SVC.  11.Patient had a CT of the head without contrast on July 16, 2006.      Impression:  1) No acute intracranial abnormality.  2) Sphenoid      sinusitis.  12.Patient had a bilateral rib study on July 18, 2006.  Impression:      1) Cardiomegaly.  Right central venous catheter tip remains in the      SVC.  2) Negative rib detail films.  13.Patient had a renal ultrasound on July 25, 2006.  Impression:  1)      Bilateral pleural effusion.  2) Mild fullness of the renal      collecting systems, especially on the right without hydronephrosis.      3) Presence of bilateral nephrostomy tubes.  14.Patient had a chest x-ray on July 27, 2006.  Impression:  Stable      mild cardiomegaly.  Minimal bibasilar atelectasis and small      effusions, both of which have improved since prior study.  15.Patient had bilateral nephrostograms on July 27, 2006.  Impression:      Bilateral nephrostogram studies performed demonstrate patent      antegrade flow from the level of the renal collecting systems to      the bladder bilaterally.  Both ureters are extremely tortuous.      Flow on the right was objectively more sluggish than on the left,      but there is no evidence of significant obstruction.  Both      nephrostomy tubes were capped and a trial of internal drainage will      not be performed with urine output via the Foley catheters and      renal function followed.  16.EEG on July 17, 2006 which showed abnormal EEG on the basis of      diffuse background slowing.  There was a nonspecific indicator of      neuron dysfunction, maybe on a primary degenerative basis or      related to a variety of toxic or metabolic etiologies, including      postictal  state.  No seizure activity was seen in the record.  17.A 2-D echo in July 14, 2006.  Summary:  Overall left ventricular      systolic function was normal.  Left ventricular  ejection fraction      was estimated, range being 55 to 60%.  This study was inadequate      for the evaluation of left ventricular regional wall motion.  There      was mild aortic valvular regurgitation.  There was mild mitral      valvular regurgitation.  There was increased thickness of the      intraatrial septum, consistent with lipomatous hypertrophy.   CONSULTATIONS:  Include:  1. Dr. Dineen Kid. Rana Snare from gynecology.  2. Heloise Purpura with urology.  3. Dr. Wilber Bihari. Fox with renal.  4. As well as interventional radiology who put in patient's bilateral      nephrostomy tubes and this was done by Dr. Jolaine Click.   CHIEF COMPLAINT:  1. Pain.  2. Can barely talk.   PRIMARY CARE PHYSICIAN:  Patient does not have a primary care physician.   HISTORY OF PRESENT ILLNESS:  Patient is a 76 year old Caucasian woman  with a past medical history significant for bladder/uterine prolapse,  presenting to the [facility] ED with bilateral lower quadrant pain with  radiation to her back.  Patient's family reports that the pain has been  present in her abdomen for approximately 1.5 years, it is described as a  stabbing, constant, worse with food and is associated with anorexia.  Patient is also presenting with shortness of breath x1 week, vaginal  spotting and bleeding for four to five years which is associated with a  60-pound weight loss in 1.5 years which is unintentional.  Patient  denies hematuria, melena, hematemesis and lower extremity pain.  Associated symptoms include metallic taste in mouth, anorexia, positive  chills, emesis x1 which was nonbloody, diarrhea x1 week with a question  of dark stools and dysuria x1 week.   ALLERGIES:  INCLUDE PENICILLIN, AMOXICILLIN, CLARITIN, PRILOSEC,  ALLEGRA, ZANTAC, TYLENOL, FLU, ORUDIS, IBUPROFEN, ENDAL, AND HISTINEX.   PAST MEDICAL HISTORY:  1. History of ear infections as a child.  2. Prolapsed uterus and bladder.   HOME MEDICATIONS:   Include Tylenol 500 mg two to three pills p.o.  q.daily.   SUBSTANCE HISTORY:  Patient denies any smoking, alcohol, cocaine, IV  drugs.   SOCIAL HISTORY:  She is married, has Medicare, has term life insurance  as well as Horticulturist, commercial.  Patient lives with her husband in  [place].   FAMILY HISTORY:  Mother passed away of CVA complications at 72 years  old.  Father passed away in his 69's.  Siblings:  Patient has a sister  with cancer, initially at time of admission it was unsure if it is lung  versus GI versus GYN, but upon further followup per patient and  patient's daughter, patient's sister has bladder cancer, endometrial  cancer and colon cancer.  She has three children, one with hypertension.   REVIEW OF SYSTEMS:  As seen in HPI.   PHYSICAL EXAMINATION:  VITAL SIGNS:  Temperature = 96.0, blood pressure  is 142/67, pulse of 125, respiratory rate of 26, O2 sats 97%.  GENERALLY:  Patient is lethargic, looks acutely ill, looks very weak and  unable to talk.  Eyes are sunken in, PERRLA.  ENT:  Dry oral mucosal.  NECK:  Thin.  No bruits, no lymphadenopathy.  RESPIRATORY:  CTA bilaterally anteriorly.  CV:  S1, S2, regular rate and rhythm bordering on tachycardia for the  rate, no murmurs, rubs or gallops.  GI:  Soft, positive bowel sounds.  Tenderness in bilateral lower  quadrants, tenderness in periumbilical region as well.  EXTREMITIES:  No lower extremity edema.  SKIN:  No abnormal visible lesions.  LYMPH:  No anterior cervical lymphadenopathy.  MUSCULOSKELETAL:  Patient has normal range of motions, but is very weak.  NEURO:  She is alert and oriented x3, able to follow some commands, she  is weak in her upper and lower extremities, PERRLA, patient cannot  follow commands to check her cranial nerves.  PSYCH:  Appropriate.   ADMISSION LABS:  Include a sodium of 141, potassium 5.8, chloride 117,  bicarb less than 5 with a gap of at least to 19.  BUN 185, creatinine   8.67, glucose 113.  Bilirubin 0.6, alk phos 110, AST 18, ALT 15, protein  9.1 and elevated.  Albumin 3.6.  WBC 34.8, hemoglobin 10.7, platelets  568, ANC 33.4, MCV 82.4.  Lipase elevated at 722.  PT = 16.9, INR = 1.3,  PTT = 35.  UA is yellow, cloudy.  Specific gravity = 1.020, pH = 6.5,  glucose is negative, hemoglobin = large, bili = negative, ketones =  negative, protein is greater than 300, urobilinogen = 0.2, nitrite  negative, leukocyte esterase = large.  Micro:  WBCs too numerous to  count, RBCs too numerous to count and bacteria many with hyaline casts.  Patient was initially FOBT-negative.  Of note about the lipase, it will  not be discussed in the discharge diagnosis.   CT findings did not show any signs of inflammation on exam.  It is  believed that the increase in lipase is likely secondary to  hypoperfusion of the splenics which likely caused elevation in her  lipase.  However, patient was made NPO and given IV fluid rehydration  and later tolerated her diet.   HOSPITAL COURSE:  1. Pyelonephritis and sepsis secondary to obstruction with      hydronephrosis, likely secondary to uterine and bladder prolapse.      Patient was admitted to the ICU unit.  It was determined that her      acute renal failure and sepsis was likely secondary to an E. coli      urinary infection secondary to her obstruction.  She was put on      Vancomycin and Primaxin initially, to cover broadly for all causes      of her sepsis.  Interventional radiology was consulted and started      bilateral nephrostomy tubes with good success, and no      complications.  Patient had these nephrostomy tubes placed as      mentioned above in the Procedures.  Patient also had a CT of the      abdomen and pelvis as well as the abdomen with the above findings      revealing obstruction.  Patient did not have any signs of      hypotension during her hospital stay and was able to maintain her      blood pressures.   She did have a very elevated white count on      coming into the hospital which slowly improved and came down to      normal limits during her hospital stay.  It was noted that her      Primaxin was discontinued early on  in the hospital stay secondary      to patient having a seizure of quite uncertain etiology, but it is      attributed to patient's hypocalcemia as well as possible Primaxin.      Patient's sepsis quickly improved and she is still continued on      Cipro at this time.  Given patient's urine and bladder prolapse and      nephrostomy tubes, Cipro is being continued at this time.  During      hospital stay, urology was consulted as well as GYN.  Pessary was      placed by gynecology with good success.  However, the pessary came      out several times during the stay and had to be reinserted.      Patient's creatinine has bounced around between 2.5 and 3.5.      Patient will have GYN followup as an outpatient for possible      hysterectomy versus further surgical management of her bladder and      uterus prolapse.  For further details, please see the extensive      hospital chart.  2. Acute renal failure, likely secondary to #1 above.  Patient's      creatinine gradually improved during the hospital stay after the      nephrostomy tubes were placed.  She continued to get IV fluid      rehydration, but her creatinine never returned to normal limits.      It is likely that patient has permanent kidney damages secondary to      her longstanding obstruction secondary to her uterine and bladder      prolapse.  Renal was consulted.  Dr. Caryn Section weighed in and no need for      urgent hemodialysis was needed.  Patient's creatinine has remained      stable between 2.5 and 3.5.  3. Patient had a bout of diarrhea during hospital stay.  Likely      secondary to antibiotics.  C. diff  was checked and was negative.      Diarrhea quickly resolved.  4. Seizure.  Patient had two to three  episodes of seizure which was      likely secondary to hypocalcemia +/- Primaxin.  Patient was quickly      put on Dilantin with resolution of seizures.  She did not have any      further episodes of seizure during her hospital stay.  Dilantin was      discontinued a few days later secondary to the belief that the      seizures were secondary to medications and hypocalcemia.  Patient     did receive an EEG with the above-mentioned results which likely      can be secondary to patient's uremia secondary to acute renal      failure.  Patient also had a CT of the head at that time which was      negative for acute bleeds or mass lesions.  5. Itching.  Patient was initially put on Benadryl with little effect.      Then, she was put on Atarax which was subsequently increased to 25      mg from 12.5 mg q.6 hours.  6. Insomnia.  Patient was started on Ambien 5 mg p.o. q.h.s. for her      insomnia.  Patient tolerated well.  7. Anemia.  Patient's anemia is believed likely secondary to an anemia  of chronic disease secondary to her kidney issues.  Patient was      FOBT during hospital stay.  She will warrant an outpatient      colonoscopy to rule out colon cancer given her family history and      that she has never had one.  This has already been set up for her,      I believe, on August 16, 2006 at [facility].  Patient will be      continued on Aranesp as well.  Her hemoglobin stabilized and      remained stable throughout her hospital stay before she transferred      to inpatient medicine.  8. Hypomagnesium.  Patient had to be repleted several times with      magnesium sulfate despite being on magnesium oxide 400 mg p.o.      b.i.d.  This was eventually increased to magnesium oxide 400 mg      p.o. t.i.d.  This will need to be monitored in outpatient setting.  9. Altered mental status.  Patient initially had significant altered      mental status, likely secondary to uremia and sepsis.  This  quickly      resolved in the next couple days as her creatinine improved, as      well she was treated with antibiotics.  Patient is perfectly lucent      at this time.  10.Deconditioning.  Patient was very deconditioned secondary to long      chronic process.  She received a PT/OT consult and is being      transferred to inpatient rehab on July 28, 2006.  11.Thrombocytopenia.  Most likely secondary to DIC secondary to      patient's acute infection and renal failure.  Patient's      thrombocytopenia quickly resolved.  12.Leukocytosis.  Initial leukocytosis is likely secondary to      patient's urosepsis.  It gradually improved and was within normal      limits at time of discharge.  13.Back pain.  Patient with a mild compression fracture in her mid-      thoracic vertebra.  Will be managed with pain management.   DISCHARGE LABS INCLUDE:  Patient had a B-Met on July 28, 2006:  Sodium  141, potassium 4.8, chloride 114, bicarb 21, BUN 35, creatinine 3.23,  glucose 76, calcium 8.1.  Magnesium of 1.9.  WBC 8.1, hemoglobin 9.0,  hematocrit 27.6, MCV 87.8, RDW 18.1 and platelets 354.  C. diff  negative x2.   Discharge vitals were stable.      Rufina Falco, M.D.  Electronically Signed     JY/MEDQ  D:  08/03/2006  T:  08/03/2006  Job:  [id number]   cc:   Duke Salvia. Marcelle Overlie, M.D.  Richard F. Caryn Section, M.D.

## 2010-06-15 NOTE — Discharge Summary (Signed)
NAMELEILANNY, FLUITT               ACCOUNT NO.:  [id number]   MEDICAL RECORD NO.:  [id number]          PATIENT TYPE:  INP   LOCATION:  5016                         FACILITY:  [facility]   PHYSICIAN:  Tacey Ruiz, MD    DATE OF BIRTH:  1934-11-20   DATE OF ADMISSION:  08/02/2006  DATE OF DISCHARGE:  08/12/2006                               DISCHARGE SUMMARY   ADDENDUM   DISCHARGE DIAGNOSES:  1. Pyelonephritis secondary to obstruction/hydronephrosis, likely      secondary to bladder and uterine prolapse.  2. Acute renal failure, likely secondary to #1.  3. Diarrhea.  4. Seizure.  5. Itching.  6. Insomnia.  7. Anemia.  8. Hypomagnesemia.  9. Hyperkalemia.  10.Altered mental status.  11.Deconditioning.  12.Thrombocytopenia.  13.Leukocytosis.  14.Back pain secondary to mild compression fracture in the thoracic      vertebra.   DISCHARGE MEDICATIONS:  1. Aranesp 100 mcg every Tuesday.  2. Thiamine 100 mg daily.  3. Folic acid 1 mg daily.  4. Senokot two tabs at night as needed.  5. Hydroxyzine 25 mg every six hours as needed for itching.  6. Vicodin 5/325 every four hours as needed for pain.   DISPOSITION AND FOLLOWUP:  Patient was seen by multiple teams while  hospitalized here at [facility] and has followup with Dr.  Marcelle Overlie, GYN doctor, here in [place]; followup is on July 17 at 10:30  a.m., phone number [phone].  Dr. Marcelle Overlie was discussing Mrs. Shave's  pessary as well as a possible hysterectomy and bladder prolapse repair  with the patient.  Following her visit with Dr. Marcelle Overlie, she is also  scheduled to visit with Dr. Annabell Howells, her urologist here in [place], on  July 25 at 1:15 p.m.  Dr. Annabell Howells will be discussing the stents which were  placed with Mrs. Scarlett as well as monitoring of her renal failure.  Lastly, Mrs. Sacco will have followup with Dr. Maple Hudson here in the [facility], phone number [phone]; this  appointment  will take place on July 28 at 9:45 a.m.  Before this  appointment, Mrs. Latino will have her CBC and B-Met checked.  Mrs.  Buckels also needs to consider having a colonoscopy at some point, as she  has a family history significant for colon cancer, is 76 years old and  has never had this performed.  Prior to this hospitalization, Mrs.  Matney had a lack of followup with doctors, and Dr. Maple Hudson established a  relationship with Mrs. Cuda while inpatient and will be able to  monitor Mrs. Ryland for any increases in her creatinine as well as do  her yearly physical exams and Pap smears.   HOSPITAL COURSE:  Mrs. Chard was discharged from inpatient rehab back  to the internal medicine teaching service on August 03, 2006.  Mrs.  Mccole chief complaint at that time was acute renal failure.  Mrs.  Landry was initially admitted to the hospital secondary to pyelo which  is believed to be from an obstructive uropathy as result of a uterine  and bladder prolapse.  Patient had bilateral nephrostomy tubes placed  prior to going to rehab as well as a pessary.  Patient was returned to  the internal medicine service from rehab, continued to be in acute renal  failure.  Her BUN at the time of transfer was 38 and her creatinine 3.3.  The plan was to continue to treat her renal failure and have GYN and  urology consulting.  Over the course of her hospitalization, Mrs.  Rama creatinine fluctuated and elevated to 4.42. A complete renal  workup was done.  FeNa was done which was greater than 0.01.  Renal  causes were considered.  A renal workup was all negative and included  urine eosinophils which were negative, a urinalysis which were negative  for casts, a FeNa which was 4.4; therefore, it led [place] to believe that  her renal failure likely still was related to her postrenal obstruction.  Initially, Mrs. Fuston had a pessary in place to help with the bladder  prolapse.  While Mrs. Reisen was on the inpatient  rehab service, the  pessary was lost during rehab.  As Mrs. Anthis's creatinine continued to  decline, GYN was reconsulted and came out and placed another pessary.  After pessary was placed, Mrs. Stephen creatinine began to improve  slightly and at the time of discharge was at 3.63.  We feel that this  creatinine will not completely improve until a hysterectomy and bladder  prolapse repair can be completed; this is to be hopefully done by Dr.  Marcelle Overlie, as he will evaluate Mrs. Vandenbos in an outpatient setting.  Mrs.  Lasalle acute renal failure, however, may never completely resolve and  it will be important to continue to monitor her creatinine as an  outpatient and establish a new baseline.  Mrs. Culverhouse also had a  urinalysis checked while she was hospitalized.  Urine cultures grew out  Vancomycin-resistant enterococci.  Mrs. Michalsky was treated with Zyvox  while she was hospitalized.  She was treated with Zyvox so she could  have double-J stents placed.  Double-J stents were placed with Mrs.  Handler on August 10, 2006; these were placed to open her ureters and her  bilateral nephrostomy tubes were removed.  This was done by  interventional radiology here at [facility] under the direction  of Dr. Annabell Howells.  Mrs. Crumm was also hyperkalemic during her  hospitalization; her potassium was up to 5.6, likely due to her  postrenal obstruction and acute renal failure.  Mrs. Nigg was given  Kayexalate one time which reduced her potassium to 4.2 and that is what  it remained day of discharge.  Mrs. Krahl also complained of itching.  Mrs. Hoel was treated with Atarax and itching resolved.  Finally, Mrs.  Bixler has a thoracic vertebra compression fracture and was in pain.  Mrs. Rathe was started on Vicodin 5/325 and this seemed to control her  pain.   VITALS AT DISCHARGE:  Blood pressure 131/67, pulse 84, respiratory rate  15, sating 97% on room air.   B-Met:  Sodium 137, potassium  4.2, chloride 104, bicarb 24, BUN 41,  creatinine 3.63 and a glucose of 56.      Tacey Ruiz, MD  Electronically Signed     JP/MEDQ  D:  08/24/2006  T:  08/24/2006  Job:  [id number]

## 2010-06-15 NOTE — Procedures (Signed)
EEG NUMBER:  [id number].   CLINICAL HISTORY:  The patient is a 76 year old with pancreatitis and  new onset of seizures.  Study is being done to look for the presence of  a seizure focus, 780.39.   PROCEDURE:  The tracing is carried out on a 32-channel digital Cadwell  recorder reformatted into 16 channel montages, with one devoted to EKG.  The patient was awake and lethargic during the recording.  The  International 10/20 system lead placement was used.  Medications include  Protonix, vancomycin, NovoLog, Lantus, Aranesp, Dilantin, Zofran, and  folic acid.  The International 10/20 system lead placement was used.   DESCRIPTION OF FINDINGS:  Dominant frequency is a 3-4 Hz delta range  activity of 100-160 microvolts.  Mixed frequency theta range activity of  50 microvolts was superimposed, and a central 8 Hz, 50 microvolt alpha  range activity is seen periodically.  There is no focal slowing.  There  is no interictal epileptiform activity in the form of spikes or sharp  waves.   EKG showed a regular sinus rhythm, with ventricular response of 78 beats  per minute.   IMPRESSION:  Abnormal EEG on the basis of diffuse background slowing.  This is a nonspecific indicator of neuronal dysfunction maybe on a  primary degenerative basis or related to a variety of toxic or metabolic  etiologies, including postictal state.  No seizure activity was seen in  the record.      Deanna Artis. Sharene Skeans, M.D.  Electronically Signed     UJW:[id number]  D:  07/17/2006 23:34:09  T:  07/18/2006 11:21:29  Job #:  [id number]   cc:   Eliseo Gum, M.D.  Fax: [phone]

## 2010-06-15 NOTE — Consult Note (Signed)
[name], [name]               ACCOUNT NO.:  [id number]   MEDICAL RECORD NO.:  [id number]          PATIENT TYPE:  INP   LOCATION:  2116                         FACILITY:  [facility]   PHYSICIAN:  Wilber Bihari. Caryn Section, M.D.   DATE OF BIRTH:  August 29, 1934   DATE OF CONSULTATION:  07/13/2006  DATE OF DISCHARGE:                                 CONSULTATION   REFERRING PHYSICIAN:  Manning Charity, MD   REASON FOR CONSULTATION:  [name] is a 76 year old, white woman  admitted with weakness, lethargy and vaginal bleeding.  She has had a  prolapsed uterus for many years.  She had BUN 185, CR 9, K 5.8, and  bicarb 9 on evaluation in the ER.  Renal consult was requested by Dr.  Lyda Perone.   PAST MEDICAL HISTORY:  She does not go to a doctor.   MEDICATIONS:  On no medications other than Tylenol.   FAMILY HISTORY:  Father died of CVA and also had diabetes mellitus.  Mother died of rheumatic heart disease.  Three brothers, one died of  lung cancer plus rheumatoid arthritis.  One brother has diabetes  mellitus and has had CVA.  Three sisters (one died of cancer of the  colon, ovary and bladder).  She has two daughters (one with  hypertension) and one son.  All this information is according to her  daughter, [name], phone number [phone].   SOCIAL HISTORY:  She was born in [place].  She is married.  She has no  occupational exposures.  No cigarettes.  No alcohol.  Home phone [phone]  [phone].   REVIEW OF SYSTEMS:  Anorexia, diarrhea, vaginal bleeding, no angina, no  claudication, no hematochezia, no renal colic.   PHYSICAL EXAMINATION:  GENERAL:  She is a awake, weak mumbles answers.  She understands requests, but it is hard for her to verbalize answers.  VITAL SIGNS:  Temperature 96, pulse 120, respirations 20, blood pressure  142/67 lying.  CHEST:  Clear.  HEART:  No rub.  Tachycardiac.  ABDOMEN:  Tender in epigastrium.  GENITALIA:  Vaginal bleeding.  Uterine prolapse (according to ER as I  did not do pelvic exam).  Hemoccult negative according to ER doctor.  EXTREMITIES:  No edema.  NEUROLOGIC:  She is cachectic.   LABORATORY DATA AND X-RAY FINDINGS:  Hemoglobin 10.7, WBC 34,800,  platelets 568,000.  Sodium 141, potassium 5.8, chloride 117, CO2 less  than 5, BUN 185, creatinine 8.7, glucose 113, calcium 9.3.  ABG with pH  7.02, pO2 149, pCO2 8.8.  Amylase not available.  Lipase 722.  Urinalysis SG 1.020, pH 6.5, TNTC wbc's, TNTC rbc's on catheterized  specimen, LE 4+, NIT negative.   CT scan of abdomen and pelvis with bilateral hydronephrosis.  Ureters  can be seen going into the pelvis.  No obvious reason for bilateral  hydronephrosis.  No nodes, no mass in ovaries, uterus or bladder.  There  is also a large gallstone.   IMPRESSION:  1. Obstructive uropathy with elevated BUN, creatinine, potassium and      depressed bicarbonate, question etiology.  2. Prolapsed bladder.  3. Leukocytosis, question etiology.  4. Epigastric tenderness with elevation of lipase (pancreas looks      normal on CT scan).  5. Gallstones.   RECOMMENDATIONS:  1. No indication yet for dialysis.  Treat with IV fluids and bicarb.  2. Radiology will place percutaneous nephrostomy tube.  Dialyze if      acidosis or hyperkalemia worsens or fluid overload or pericarditis.      Would also check SPEP and UPEP.  3. GU to see, question if this is the etiology of bilateral      hydronephrosis.  4. Cover with antibiotics after cultures.  5. Nothing per mouth.  6. Follow amylase.  7. Discussed with Dr. Shannan Harper and Dr. Lyda Perone.           ______________________________  Wilber Bihari. Caryn Section, M.D.     RFF/MEDQ  D:  07/13/2006  T:  07/14/2006  Job:  [id number]

## 2010-06-15 NOTE — Consult Note (Signed)
[name], [name]               ACCOUNT NO.:  [id number]   MEDICAL RECORD NO.:  [id number]          PATIENT TYPE:  IPS   LOCATION:  4002                         FACILITY:  [facility]   PHYSICIAN:  Dineen Kid. Rana Snare, M.D.    DATE OF BIRTH:  02-20-1934   DATE OF CONSULTATION:  07/26/2006  DATE OF DISCHARGE:                                 CONSULTATION   CONSULTATION:   HISTORY OF PRESENT ILLNESS:  [name] is a 76 year old with uterine  procidentia with urinary obstruction currently with percutaneous  nephrostomies, was seen and evaluated two weeks by my partner Dr. Mancel Bale for procidentia.  I have been reconsulted for replacement of the  prolapsed uterus and placement of pessary to temporize measures in hopes  of relieving the obstruction until something more permanent can be  planned.  The patient at this time apparently is doing well and is  scheduled to go to redilatation in the next several days.  I did spend  some time speaking with the patient, her husband and family about the  different options.   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  With the nurse present I was able to reduce the prolapsed  uterus into his normal anatomic position.   After discussing and showing different pessaries, I was able to place a  small inflatable pessary with good support noted.  I did inflate the  pessary.  I did have the patient stand, walk and bend, and lay back down  and recheck of the pessary revealed good placement with no evidence of  further prolapse and also good support without being to tight against  the urinogenital diaphragm.  The nurse Raynelle Fanning that helped me also  apparently has some L&D and GYN experience and appears to be comfortable  with checking and placing or even replacing the pessary if needed.   IMPRESSION AND PLAN:  Uterine procidentia.  Pessary was fitted and  placed with a small inflatable pessary.  I left her pessary in  place, as  well as some Trimo-San cream to use as needed including the inflatable  bulb and instructions both for her and her husband as well.  Plan on  having her followup in the office in two to three weeks after discharge  for recheck and replacement at which time we could further teach her how  to remove and replace on her own, or we could do this in the office.  Please call for problems or questions with regards to this, otherwise we  will look forward to seeing her in the office in the next several weeks.      Dineen Kid Rana Snare, M.D.  Electronically Signed     DCL/MEDQ  D:  07/28/2006  T:  07/29/2006  Job:  [id number]

## 2010-06-15 NOTE — Consult Note (Signed)
[name], [name]               ACCOUNT NO.:  [id number]   MEDICAL RECORD NO.:  [id number]          PATIENT TYPE:  INP   LOCATION:  2116                         FACILITY:  [facility]   PHYSICIAN:  Heloise Purpura, MD      DATE OF BIRTH:  13-Aug-1934   DATE OF CONSULTATION:  DATE OF DISCHARGE:                                 CONSULTATION   REASON FOR CONSULTATION:  1. Acute renal failure.  2. Bilateral hydronephrosis.  3. Vaginal prolapse.   CONSULTING PHYSICIAN:  Manning Charity, M.D.   HISTORY:  [name] is a 76 year old female who has had little previous  medical care.  She was recently brought to the emergency department on  July 12, 2006, due to complaints of lethargy and decreased appetite.  She has an intense fear of doctors and has avoided medical care for a  long period of time.  On admission to the emergency room, she was found  to be in acute renal failure with a creatinine of 9.6.  She was also  noted to have a white count of 34.8.  A urinalysis demonstrated too  numerous to count white blood cells, too numerous to count red blood  cells, and many bacteria.  This was sent for urine culture.  A CT scan  without contrast was performed which did demonstrate bilateral  hydronephrosis with hydroureter down to the mid ureter bilaterally.  The  patient is noted to have a history of a grade 4 cystocele and uterine  prolapse.  However, she did not have any dilation of her ureters down to  the level of the UVJ.  Her bladder did not appear to be overly distended  on her CT scan.  Her uterus and bladder were noted to be intrapelvic on  her CT scan.  Although the patient did not have any obvious objective  fever, due to her leukocytosis and acute renal failure she did have  bilateral percutaneous nephrostomy tubes placed for optimal renal  drainage.   PAST MEDICAL HISTORY:  No known past medical history due to the fact the  patient has not received recent medical care.   PAST  SURGICAL HISTORY:  None known.   CURRENT MEDICATIONS:  1. Ceftriaxone.  2. Imipenem.  3. Morphine.  4. Zofran.  5. Protonix.  6. Vancomycin.   ALLERGIES:  The patient does have multiple reported allergies including:  1. PENICILLIN.  2. CLARITIN.  3. PRILOSEC.  4. ALLEGRA.  5. ZANTAC.  6. TYLENOL.  7. IBUPROFEN.   It is unclear what reaction the patient has to each of these  medications.   FAMILY HISTORY:  No history of GU malignancy.   SOCIAL HISTORY:  The patient is married.  She lives with her husband in  [place].   REVIEW OF SYSTEMS:  Positive for chills, diarrhea, vaginal bleeding,  chest pain, dyspnea, cough, weakness, anorexia.  All other systems are  reviewed and are negative except for as in the history.   PHYSICAL EXAMINATION:  VITAL SIGNS:  Temperature 96, heart rate 118,  blood pressure 126/82.  CONSTITUTIONAL:  The patient is alert  and awake.  She is unable to  appropriately answer questions, however.  She appears visibly chilled.  NECK:  Supple without lymphadenopathy.  ABDOMEN:  Tender over the right lower quadrant to palpation.  No rebound  tenderness or guarding.  BACK:  No CVA tenderness.  The patient has indwelling nephrostomy tube  draining light pink urine.  GENITOURINARY:  The patient has significant vaginal prolapse.  Although  I am unable to optimally examine the patient due to her position in her  bed, she does appear to have mostly an anterior component of her  prolapse, indicating a grade 4 cystocele.  The patient's cervix is  palpated and also appears prolapsed.  The patient does have an  indwelling Foley catheter which is draining light pink urine.  The  patient's vaginal prolapse is easily reduced.  EXTREMITIES:  No edema.   LABORATORY DATA:  Urinalysis demonstrated many bacteria with too  numerous to count red blood cells and too numerous to count white blood  cells.  This has been sent for urine culture.  White blood count 34.8,   hemoglobin 10.7.  Creatinine has improved to 7.7 following nephrostomy  drainage.   IMAGING:  The patient's CT scan was independently reviewed.  This  demonstrates bilateral hydroureteronephrosis.  The ureters are dilated  down to the mid portion of the ureters with no clear etiology for the  patient's obstruction.  There are no urinary calculi and there does not  appear to be any obvious source for extrinsic obstruction.  The  patient's bladder and uterus do appear to be intrapelvic.  The patient's  bladder does not appear to be markedly distended.   IMPRESSION:  1. Acute renal failure, likely secondary to ureteral obstruction.  2. Urinary tract infection.  3. Vaginal prolapse.   RECOMMENDATIONS:  1. I agree with bilateral nephrostomy drainage in the setting of an      uncertain etiology for her ureteral obstruction.  This will      optimize renal drainage.  The patient will need to be followed      closely for a possible postobstructive diuresis with careful      monitoring of her electrolytes and volume status.  I would      recommend following her creatinine and this hopefully will improve      following nephrostomy drainage.  I would also continue to leave an      indwelling Foley catheter.  2. Bilateral ureteral obstruction:  The etiology of the patient's      ureteral obstruction will be further evaluated once appropriate.      If her creatinine does return to normal, it would likely be      beneficial to proceed with a contrast CT scan.  The patient also      may need further evaluation with cystoscopy and retrograde      pyelography to further      determine the etiology of her ureteral obstruction.  3. Agree with broad-spectrum IV antibiotics pending the patient's      final urine culture.  4. The patient will continue to be followed by the urology service      during her hospitalization.           ______________________________  Heloise Purpura, MD Electronically  Signed     LB/MEDQ  D:  07/14/2006  T:  07/14/2006  Job:  [id number]   cc:   Manning Charity, MD

## 2010-06-15 NOTE — Discharge Summary (Signed)
[name], [name]               ACCOUNT NO.:  [id number]   MEDICAL RECORD NO.:  [id number]          PATIENT TYPE:  IPS   LOCATION:  4002                         FACILITY:  [facility]   PHYSICIAN:  Ellwood Dense, M.D.   DATE OF BIRTH:  1934/08/05   DATE OF ADMISSION:  07/28/2006  DATE OF DISCHARGE:  08/02/2006                               DISCHARGE SUMMARY   DISCHARGE DIAGNOSES:  1. Deconditioning, multi-medical.  2. Obstructive uropathy.  3. Acute renal failure.  4. Prolapsed uterus status post pessary device.  5. Anemia.   This is a 76 year old white female admitted June 12th with weakness,  lethargy, as well as vaginal bleeding.  She had BUN and creatinine of  185 and 9 respectively, with potassium 5.8.  Renal service consult to  diagnose obstructive uropathy.  CT of the abdomen positive for bilateral  hydronephrosis.  Also, finding prolapsed uterus with bilateral  nephrostomy tubes placed.  The patient was placed on TNA for nutritional  support.  Diet was advanced to regular.  Generally, the creatinine  improved with serial monitoring.  She had a pessary device placed on  June 25th for prolapsed uterus.  Foley catheter tube in place.  Nephrostomy tubes in place and presently capped.   PAST MEDICAL HISTORY:  See discharge diagnoses.  No alcohol or tobacco.   ALLERGIES:  PENICILLIN, AMOXICILLIN, ZANTAC, IBUPROFEN AND TYLENOL.   SOCIAL HISTORY:  She lives with her husband and assistance as needed.  She was on no present medications, at time of admission to the hospital.   REHABILITATION AND HOSPITAL COURSE:  The patient was admitted to  inpatient rehab services with therapies initiated on a 3-hour daily  basis, consisting of physical therapy, occupational therapy and  rehabilitation nursing.  The following issues were addressed during the  patient's rehabilitation stay.  Pertaining to [name]'s functional  deficits after a long hospital complicated course, she was  independent  in her room functionally.  However, she had worsening renal function  during her rehabilitation course, her latest creatinine of 3.33.  Nephrostomy tubes remained in place, followed by interventional  radiology.  No plans for stenting, as they were capped and appeared to  be functioning well.  Her pessary device that had been placed, had now  dislodged, had been lost, question present plan of care regarding this.  She remained on Aricept for anemia.  Due to her medical situation of  worsening renal function, renal services was to follow-up.  She would be  discharged to acute care services, for questionable plan of care  regarding nephrostomy tubes.  There was no present plan for stenting at  this time.  Renal function to be addressed.  Urology to follow up with  [name].   LATEST LABS:  Showed a sodium 141, potassium 5.2, BUN 42, creatinine  3.46.  Latest urine culture was re-incubated.  Latest CBC with  hemoglobin 96, hematocrit 30.1, platelet 461,000, WBC 7.4.  She was  discharged in stable condition.  All medications to be adjusted as per  teaching service, Dr. Harvie Junior, as the patient was transferred to their  service.      [name], P.A.    ______________________________  Ellwood Dense, M.D.    DA/MEDQ  D:  08/03/2006  T:  08/03/2006  Job:  [id number]   cc:   Ranelle Oyster, M.D.  Dr. Rana Snare

## 2010-07-07 ENCOUNTER — Other Ambulatory Visit: Payer: Self-pay | Admitting: Nephrology

## 2010-07-07 DIAGNOSIS — Z1231 Encounter for screening mammogram for malignant neoplasm of breast: Secondary | ICD-10-CM

## 2010-07-12 ENCOUNTER — Other Ambulatory Visit: Payer: Self-pay | Admitting: Obstetrics and Gynecology

## 2010-07-15 ENCOUNTER — Ambulatory Visit: Payer: Self-pay

## 2010-07-20 ENCOUNTER — Ambulatory Visit
Admission: RE | Admit: 2010-07-20 | Discharge: 2010-07-20 | Disposition: A | Discharge status: Home / Self Care | Payer: Medicare Other | Source: Ambulatory Visit | Attending: Nephrology | Admitting: Nephrology

## 2010-07-20 DIAGNOSIS — Z1231 Encounter for screening mammogram for malignant neoplasm of breast: Secondary | ICD-10-CM

## 2010-07-22 ENCOUNTER — Other Ambulatory Visit: Payer: Self-pay | Admitting: Nephrology

## 2010-07-22 DIAGNOSIS — R928 Other abnormal and inconclusive findings on diagnostic imaging of breast: Secondary | ICD-10-CM

## 2010-07-29 ENCOUNTER — Ambulatory Visit
Admission: RE | Admit: 2010-07-29 | Discharge: 2010-07-29 | Disposition: A | Discharge status: Home / Self Care | Payer: Medicare Other | Source: Ambulatory Visit | Attending: Nephrology | Admitting: Nephrology

## 2010-07-29 DIAGNOSIS — R928 Other abnormal and inconclusive findings on diagnostic imaging of breast: Secondary | ICD-10-CM

## 2010-11-16 LAB — URINE MICROSCOPIC-ADD ON

## 2010-11-16 LAB — BASIC METABOLIC PANEL
BUN: 34 — ABNORMAL HIGH
BUN: 39 — ABNORMAL HIGH
BUN: 41 — ABNORMAL HIGH
BUN: 42 — ABNORMAL HIGH
BUN: 42 — ABNORMAL HIGH
BUN: 49 — ABNORMAL HIGH
BUN: 51 — ABNORMAL HIGH
BUN: 55 — ABNORMAL HIGH
CO2: 22
CO2: 22
CO2: 23
CO2: 23
CO2: 24
CO2: 24
Calcium: 8.5
Calcium: 8.6
Calcium: 8.7
Calcium: 8.8
Calcium: 8.8
Calcium: 9
Calcium: 9
Chloride: 104
Chloride: 105
Chloride: 106
Chloride: 106
Chloride: 110
Chloride: 111
Chloride: 112
Chloride: 114 — ABNORMAL HIGH
Creatinine, Ser: 3.36 — ABNORMAL HIGH
Creatinine, Ser: 3.41 — ABNORMAL HIGH
Creatinine, Ser: 3.41 — ABNORMAL HIGH
Creatinine, Ser: 3.46 — ABNORMAL HIGH
Creatinine, Ser: 3.76 — ABNORMAL HIGH
Creatinine, Ser: 3.91 — ABNORMAL HIGH
Creatinine, Ser: 4 — ABNORMAL HIGH
Creatinine, Ser: 4.39 — ABNORMAL HIGH
Creatinine, Ser: 4.42 — ABNORMAL HIGH
GFR calc Af Amer: 12 — ABNORMAL LOW
GFR calc Af Amer: 13 — ABNORMAL LOW
GFR calc Af Amer: 14 — ABNORMAL LOW
GFR calc Af Amer: 16 — ABNORMAL LOW
GFR calc non Af Amer: 10 — ABNORMAL LOW
GFR calc non Af Amer: 11 — ABNORMAL LOW
GFR calc non Af Amer: 11 — ABNORMAL LOW
GFR calc non Af Amer: 11 — ABNORMAL LOW
GFR calc non Af Amer: 14 — ABNORMAL LOW
Glucose, Bld: 56 — ABNORMAL LOW
Glucose, Bld: 77
Glucose, Bld: 78
Glucose, Bld: 83
Glucose, Bld: 83
Glucose, Bld: 93
Potassium: 4.2
Potassium: 4.5
Potassium: 4.6
Potassium: 5.2 — ABNORMAL HIGH
Sodium: 138
Sodium: 139
Sodium: 139
Sodium: 139
Sodium: 140

## 2010-11-16 LAB — CBC
HCT: 30.1 — ABNORMAL LOW
HCT: 32.6 — ABNORMAL LOW
HCT: 35.8 — ABNORMAL LOW
Hemoglobin: 10.2 — ABNORMAL LOW
Hemoglobin: 10.3 — ABNORMAL LOW
Hemoglobin: 10.6 — ABNORMAL LOW
Hemoglobin: 9.6 — ABNORMAL LOW
Hemoglobin: 9.7 — ABNORMAL LOW
MCHC: 32
MCHC: 32
MCHC: 32.1
MCHC: 32.2
MCHC: 32.2
MCHC: 32.2
MCHC: 32.3
MCHC: 32.7
MCV: 89.2
MCV: 89.9
MCV: 90.2
MCV: 90.4
MCV: 90.6
MCV: 91.1
Platelets: 334
Platelets: 360
Platelets: 361
Platelets: 363
Platelets: 384
Platelets: 392
Platelets: 407 — ABNORMAL HIGH
RBC: 3.46 — ABNORMAL LOW
RBC: 3.63 — ABNORMAL LOW
RBC: 3.68 — ABNORMAL LOW
RDW: 18.6 — ABNORMAL HIGH
RDW: 18.6 — ABNORMAL HIGH
RDW: 19.1 — ABNORMAL HIGH
RDW: 19.1 — ABNORMAL HIGH
RDW: 19.2 — ABNORMAL HIGH
RDW: 19.3 — ABNORMAL HIGH
RDW: 19.4 — ABNORMAL HIGH
WBC: 10.6 — ABNORMAL HIGH
WBC: 8.7
WBC: 8.8
WBC: 9.4
WBC: 9.4

## 2010-11-16 LAB — URINALYSIS, ROUTINE W REFLEX MICROSCOPIC
Bilirubin Urine: NEGATIVE
Nitrite: NEGATIVE
Nitrite: NEGATIVE
Nitrite: NEGATIVE
Protein, ur: NEGATIVE
Protein, ur: NEGATIVE
Specific Gravity, Urine: 1.005
Specific Gravity, Urine: 1.009
Specific Gravity, Urine: 1.01
Urobilinogen, UA: 0.2
Urobilinogen, UA: 0.2
Urobilinogen, UA: 0.2
pH: 7.5

## 2010-11-16 LAB — URINE CULTURE
Colony Count: 25000
Colony Count: 60000

## 2010-11-16 LAB — COMPREHENSIVE METABOLIC PANEL
ALT: 13
AST: 17
CO2: 20
Calcium: 8.5
Creatinine, Ser: 3.33 — ABNORMAL HIGH
GFR calc Af Amer: 17 — ABNORMAL LOW
GFR calc non Af Amer: 14 — ABNORMAL LOW
Sodium: 143
Total Protein: 5.7 — ABNORMAL LOW

## 2010-11-16 LAB — MAGNESIUM
Magnesium: 2.1
Magnesium: 2.1
Magnesium: 2.2
Magnesium: 2.3

## 2010-11-16 LAB — SODIUM, URINE, RANDOM: Sodium, Ur: 66

## 2010-11-16 LAB — CREATININE, URINE, RANDOM: Creatinine, Urine: 46.3

## 2010-11-17 LAB — RENAL FUNCTION PANEL
CO2: 31
Calcium: 6.8 — ABNORMAL LOW
Creatinine, Ser: 4.13 — ABNORMAL HIGH
GFR calc Af Amer: 13 — ABNORMAL LOW
GFR calc non Af Amer: 11 — ABNORMAL LOW

## 2010-11-17 LAB — CLOSTRIDIUM DIFFICILE EIA
C difficile Toxins A+B, EIA: NEGATIVE
C difficile Toxins A+B, EIA: NEGATIVE

## 2010-11-17 LAB — DIFFERENTIAL
Basophils Absolute: 0
Basophils Absolute: 0.1
Basophils Relative: 0
Basophils Relative: 0
Eosinophils Absolute: 0.4
Eosinophils Relative: 3
Eosinophils Relative: 7 — ABNORMAL HIGH
Lymphocytes Relative: 12
Lymphocytes Relative: 36
Lymphocytes Relative: 4 — ABNORMAL LOW
Lymphs Abs: 0.8
Lymphs Abs: 2
Lymphs Abs: 2.4
Monocytes Absolute: 1.3 — ABNORMAL HIGH
Monocytes Relative: 10
Monocytes Relative: 6
Neutro Abs: 19.3 — ABNORMAL HIGH
Neutro Abs: 3.2
Neutro Abs: 8.5 — ABNORMAL HIGH
Neutrophils Relative %: 75
Neutrophils Relative %: 91 — ABNORMAL HIGH

## 2010-11-17 LAB — BASIC METABOLIC PANEL
BUN: 30 — ABNORMAL HIGH
BUN: 32 — ABNORMAL HIGH
BUN: 34 — ABNORMAL HIGH
BUN: 35 — ABNORMAL HIGH
BUN: 35 — ABNORMAL HIGH
BUN: 36 — ABNORMAL HIGH
CO2: 21
CO2: 21
CO2: 21
CO2: 21
CO2: 22
CO2: 23
Calcium: 7.4 — ABNORMAL LOW
Calcium: 7.6 — ABNORMAL LOW
Calcium: 7.9 — ABNORMAL LOW
Calcium: 8 — ABNORMAL LOW
Calcium: 8 — ABNORMAL LOW
Calcium: 8.1 — ABNORMAL LOW
Calcium: 8.2 — ABNORMAL LOW
Chloride: 105
Chloride: 111
Chloride: 112
Chloride: 112
Chloride: 116 — ABNORMAL HIGH
Creatinine, Ser: 2.52 — ABNORMAL HIGH
Creatinine, Ser: 2.56 — ABNORMAL HIGH
Creatinine, Ser: 2.79 — ABNORMAL HIGH
Creatinine, Ser: 2.82 — ABNORMAL HIGH
Creatinine, Ser: 2.84 — ABNORMAL HIGH
Creatinine, Ser: 3.23 — ABNORMAL HIGH
Creatinine, Ser: 4.15 — ABNORMAL HIGH
GFR calc Af Amer: 13 — ABNORMAL LOW
GFR calc Af Amer: 17 — ABNORMAL LOW
GFR calc Af Amer: 18 — ABNORMAL LOW
GFR calc Af Amer: 20 — ABNORMAL LOW
GFR calc Af Amer: 20 — ABNORMAL LOW
GFR calc non Af Amer: 11 — ABNORMAL LOW
GFR calc non Af Amer: 14 — ABNORMAL LOW
GFR calc non Af Amer: 14 — ABNORMAL LOW
GFR calc non Af Amer: 16 — ABNORMAL LOW
GFR calc non Af Amer: 17 — ABNORMAL LOW
GFR calc non Af Amer: 17 — ABNORMAL LOW
GFR calc non Af Amer: 17 — ABNORMAL LOW
GFR calc non Af Amer: 19 — ABNORMAL LOW
Glucose, Bld: 115 — ABNORMAL HIGH
Glucose, Bld: 67 — ABNORMAL LOW
Glucose, Bld: 77
Glucose, Bld: 85
Glucose, Bld: 92
Glucose, Bld: 99
Potassium: 3 — ABNORMAL LOW
Potassium: 3.6
Potassium: 4
Potassium: 4.8
Potassium: 4.8
Potassium: 4.9
Potassium: 4.9
Sodium: 138
Sodium: 140
Sodium: 140
Sodium: 143

## 2010-11-17 LAB — COMPREHENSIVE METABOLIC PANEL
ALT: 10
ALT: 10
ALT: 22
AST: 17
AST: 24
AST: 25
AST: 27
AST: 30
Albumin: 2.2 — ABNORMAL LOW
Alkaline Phosphatase: 66
Alkaline Phosphatase: 83
BUN: 36 — ABNORMAL HIGH
CO2: 21
CO2: 22
CO2: 25
CO2: 25
CO2: 26
CO2: 28
Calcium: 7.4 — ABNORMAL LOW
Calcium: 7.5 — ABNORMAL LOW
Calcium: 8.2 — ABNORMAL LOW
Calcium: 8.3 — ABNORMAL LOW
Chloride: 105
Chloride: 105
Chloride: 111
Chloride: 112
Creatinine, Ser: 2.57 — ABNORMAL HIGH
Creatinine, Ser: 2.95 — ABNORMAL HIGH
Creatinine, Ser: 3.21 — ABNORMAL HIGH
Creatinine, Ser: 3.69 — ABNORMAL HIGH
GFR calc Af Amer: 14 — ABNORMAL LOW
GFR calc Af Amer: 17 — ABNORMAL LOW
GFR calc Af Amer: 19 — ABNORMAL LOW
GFR calc Af Amer: 19 — ABNORMAL LOW
GFR calc Af Amer: 22 — ABNORMAL LOW
GFR calc non Af Amer: 11 — ABNORMAL LOW
GFR calc non Af Amer: 12 — ABNORMAL LOW
GFR calc non Af Amer: 14 — ABNORMAL LOW
GFR calc non Af Amer: 16 — ABNORMAL LOW
GFR calc non Af Amer: 16 — ABNORMAL LOW
GFR calc non Af Amer: 18 — ABNORMAL LOW
Glucose, Bld: 142 — ABNORMAL HIGH
Glucose, Bld: 175 — ABNORMAL HIGH
Glucose, Bld: 94
Potassium: 3.7
Potassium: 5
Sodium: 139
Sodium: 140
Sodium: 142
Total Bilirubin: 0.6
Total Bilirubin: 0.9
Total Bilirubin: 0.9

## 2010-11-17 LAB — POCT I-STAT 3, ART BLOOD GAS (G3+)
Bicarbonate: 23.9
O2 Saturation: 97
Operator id: 126001
Operator id: 277331
Patient temperature: 100
pCO2 arterial: 40.5
pH, Arterial: 7.391
pH, Arterial: 7.473 — ABNORMAL HIGH
pO2, Arterial: 86
pO2, Arterial: 90

## 2010-11-17 LAB — CBC
HCT: 25.5 — ABNORMAL LOW
HCT: 25.6 — ABNORMAL LOW
HCT: 26.3 — ABNORMAL LOW
HCT: 26.4 — ABNORMAL LOW
HCT: 26.9 — ABNORMAL LOW
HCT: 26.9 — ABNORMAL LOW
HCT: 27.4 — ABNORMAL LOW
Hemoglobin: 8.4 — ABNORMAL LOW
Hemoglobin: 8.8 — ABNORMAL LOW
Hemoglobin: 8.9 — ABNORMAL LOW
Hemoglobin: 9 — ABNORMAL LOW
Hemoglobin: 9.4 — ABNORMAL LOW
Hemoglobin: 9.4 — ABNORMAL LOW
MCHC: 32.4
MCHC: 32.4
MCHC: 32.5
MCHC: 32.5
MCHC: 32.7
MCHC: 32.7
MCHC: 32.9
MCHC: 33.3
MCHC: 33.5
MCV: 84.4
MCV: 86.6
MCV: 87.4
MCV: 87.7
MCV: 87.8
MCV: 88.5
MCV: 89.1
Platelets: 140 — ABNORMAL LOW
Platelets: 144 — ABNORMAL LOW
Platelets: 153
Platelets: 171
Platelets: 192
Platelets: 243
Platelets: 354
Platelets: 400
RBC: 2.9 — ABNORMAL LOW
RBC: 2.93 — ABNORMAL LOW
RBC: 2.97 — ABNORMAL LOW
RBC: 3.08 — ABNORMAL LOW
RBC: 3.12 — ABNORMAL LOW
RBC: 3.16 — ABNORMAL LOW
RBC: 3.22 — ABNORMAL LOW
RBC: 3.28 — ABNORMAL LOW
RBC: 3.3 — ABNORMAL LOW
RDW: 16.8 — ABNORMAL HIGH
RDW: 17.1 — ABNORMAL HIGH
RDW: 17.2 — ABNORMAL HIGH
RDW: 17.4 — ABNORMAL HIGH
RDW: 17.5 — ABNORMAL HIGH
WBC: 10.4
WBC: 12.6 — ABNORMAL HIGH
WBC: 15 — ABNORMAL HIGH
WBC: 15.4 — ABNORMAL HIGH
WBC: 15.5 — ABNORMAL HIGH
WBC: 16.9 — ABNORMAL HIGH
WBC: 18.9 — ABNORMAL HIGH
WBC: 21.3 — ABNORMAL HIGH
WBC: 7.9
WBC: 8.1

## 2010-11-17 LAB — PHENYTOIN LEVEL, TOTAL
Phenytoin Lvl: 11
Phenytoin Lvl: 6.2 — ABNORMAL LOW

## 2010-11-17 LAB — MAGNESIUM
Magnesium: 1.7
Magnesium: 1.7
Magnesium: 1.8
Magnesium: 1.9
Magnesium: 2
Magnesium: 2.1

## 2010-11-17 LAB — PROTIME-INR
INR: 1.1
Prothrombin Time: 14.5

## 2010-11-17 LAB — PHOSPHORUS: Phosphorus: 3.6

## 2010-11-17 LAB — MANGANESE: Manganese, Blood: 1.7 (ref 0.0–2.0)

## 2010-11-17 LAB — TRIGLYCERIDES: Triglycerides: 132

## 2010-11-17 LAB — CREATININE, URINE, RANDOM: Creatinine, Urine: 23.5

## 2010-11-17 LAB — T4, FREE: Free T4: 1.29

## 2010-11-17 LAB — PROLACTIN: Prolactin: 47.6

## 2010-11-18 LAB — COMPREHENSIVE METABOLIC PANEL
ALT: 13
AST: 19
AST: 22
Albumin: 1.8 — ABNORMAL LOW
Alkaline Phosphatase: 70
BUN: 101 — ABNORMAL HIGH
CO2: 11 — ABNORMAL LOW
Calcium: 6.2 — CL
Chloride: 115 — ABNORMAL HIGH
Chloride: 127 — ABNORMAL HIGH
Creatinine, Ser: 4.65 — ABNORMAL HIGH
Creatinine, Ser: 6.14 — ABNORMAL HIGH
GFR calc Af Amer: 11 — ABNORMAL LOW
GFR calc Af Amer: 8 — ABNORMAL LOW
GFR calc non Af Amer: 7 — ABNORMAL LOW
Potassium: 2.7 — CL
Sodium: 150 — ABNORMAL HIGH
Total Bilirubin: 0.4
Total Bilirubin: 0.4
Total Protein: 4.7 — ABNORMAL LOW

## 2010-11-18 LAB — IRON AND TIBC
Iron: 24 — ABNORMAL LOW
Saturation Ratios: 12 — ABNORMAL LOW
Saturation Ratios: 13 — ABNORMAL LOW
TIBC: 141 — ABNORMAL LOW
TIBC: 205 — ABNORMAL LOW
UIBC: 123
UIBC: 181

## 2010-11-18 LAB — HEPATIC FUNCTION PANEL
ALT: 15
AST: 18
Albumin: 3.6
Alkaline Phosphatase: 110
Bilirubin, Direct: <0.1
Total Bilirubin: 0.6
Total Protein: 9.1 — ABNORMAL HIGH

## 2010-11-18 LAB — CULTURE, BLOOD (ROUTINE X 2)
Culture: NO GROWTH
Culture: NO GROWTH

## 2010-11-18 LAB — BASIC METABOLIC PANEL
BUN: 179 — ABNORMAL HIGH
BUN: 84 — ABNORMAL HIGH
CO2: 34 — ABNORMAL HIGH
CO2: 9 — CL
CO2: <5 — CL
Calcium: 6.5 — ABNORMAL LOW
Calcium: 8.1 — ABNORMAL LOW
Calcium: 9.3
Chloride: 120 — ABNORMAL HIGH
Creatinine, Ser: 7.69 — ABNORMAL HIGH
GFR calc Af Amer: 5 — ABNORMAL LOW
GFR calc Af Amer: 6 — ABNORMAL LOW
GFR calc non Af Amer: 5 — ABNORMAL LOW
Glucose, Bld: 104 — ABNORMAL HIGH
Glucose, Bld: 113 — ABNORMAL HIGH
Glucose, Bld: 180 — ABNORMAL HIGH
Potassium: 3 — ABNORMAL LOW
Potassium: 3.4 — ABNORMAL LOW
Potassium: 5.8 — ABNORMAL HIGH
Sodium: 141
Sodium: 143
Sodium: 147 — ABNORMAL HIGH

## 2010-11-18 LAB — URINE DRUGS OF ABUSE SCREEN W ALC, ROUTINE (REF LAB)
Amphetamine Screen, Ur: NEGATIVE
Barbiturate Quant, Ur: NEGATIVE
Benzodiazepines.: NEGATIVE
Cocaine Metabolites: NEGATIVE
Creatinine,U: 74.8
Ethyl Alcohol: <5
Marijuana Metabolite: NEGATIVE
Methadone: NEGATIVE
Opiate Screen, Urine: NEGATIVE
Phencyclidine (PCP): NEGATIVE
Propoxyphene: NEGATIVE

## 2010-11-18 LAB — POCT I-STAT 3, ART BLOOD GAS (G3+)
Bicarbonate: 8.8 — ABNORMAL LOW
O2 Saturation: 98
Operator id: 245011
Operator id: 291881
Patient temperature: 96.1
TCO2: 9
TCO2: <5
pCO2 arterial: 16.1 — CL
pCO2 arterial: 8.8 — CL
pH, Arterial: 7.026 — CL
pH, Arterial: 7.287 — ABNORMAL LOW
pH, Arterial: 7.343 — ABNORMAL LOW
pO2, Arterial: 130 — ABNORMAL HIGH
pO2, Arterial: 149 — ABNORMAL HIGH

## 2010-11-18 LAB — APTT: aPTT: 35

## 2010-11-18 LAB — CBC
HCT: 24.3 — ABNORMAL LOW
HCT: 25.2 — ABNORMAL LOW
HCT: 25.5 — ABNORMAL LOW
HCT: 32.1 — ABNORMAL LOW
Hemoglobin: 8.5 — ABNORMAL LOW
Hemoglobin: 8.7 — ABNORMAL LOW
MCHC: 33.5
MCHC: 33.7
MCHC: 33.8
MCV: 81.8
MCV: 82.4
MCV: 84.8
MCV: 85.9
Platelets: 253
Platelets: 259
Platelets: 397
RBC: 2.31 — ABNORMAL LOW
RBC: 3.05 — ABNORMAL LOW
RBC: 3.09 — ABNORMAL LOW
RBC: 3.9
RDW: 16.8 — ABNORMAL HIGH
RDW: 16.8 — ABNORMAL HIGH
WBC: 17.4 — ABNORMAL HIGH
WBC: 24.2 — ABNORMAL HIGH
WBC: 30.4 — ABNORMAL HIGH
WBC: 34.8 — ABNORMAL HIGH

## 2010-11-18 LAB — URINALYSIS, ROUTINE W REFLEX MICROSCOPIC
Bilirubin Urine: NEGATIVE
Glucose, UA: NEGATIVE
Ketones, ur: NEGATIVE
Nitrite: NEGATIVE
Protein, ur: >300 — AB
Specific Gravity, Urine: 1.02
Urobilinogen, UA: 0.2
pH: 6.5

## 2010-11-18 LAB — TYPE AND SCREEN
ABO/RH(D): A POS
Antibody Screen: NEGATIVE

## 2010-11-18 LAB — AMYLASE: Amylase: 635 — ABNORMAL HIGH

## 2010-11-18 LAB — OCCULT BLOOD X 1 CARD TO LAB, STOOL
Fecal Occult Bld: NEGATIVE
Fecal Occult Bld: POSITIVE

## 2010-11-18 LAB — UIFE/LIGHT CHAINS/TP QN, 24-HR UR
Albumin, U: DETECTED
Alpha 1, Urine: DETECTED — AB
Beta, Urine: DETECTED — AB
Free Lambda Excretion/Day: 78.99
Gamma Globulin, Urine: DETECTED — AB
Time: 24

## 2010-11-18 LAB — TSH: TSH: 0.267 — ABNORMAL LOW

## 2010-11-18 LAB — URINE CULTURE: Colony Count: >=100000

## 2010-11-18 LAB — CLOSTRIDIUM DIFFICILE EIA

## 2010-11-18 LAB — KETONES, QUALITATIVE: Acetone, Bld: NEGATIVE

## 2010-11-18 LAB — URINE MICROSCOPIC-ADD ON

## 2010-11-18 LAB — RENAL FUNCTION PANEL
Albumin: 1.9 — ABNORMAL LOW
Albumin: 2 — ABNORMAL LOW
BUN: 126 — ABNORMAL HIGH
CO2: 17 — ABNORMAL LOW
Chloride: 123 — ABNORMAL HIGH
Creatinine, Ser: 5.14 — ABNORMAL HIGH
Creatinine, Ser: 5.18 — ABNORMAL HIGH
GFR calc Af Amer: 10 — ABNORMAL LOW
GFR calc non Af Amer: 8 — ABNORMAL LOW
Glucose, Bld: 103 — ABNORMAL HIGH
Phosphorus: 2.9
Potassium: 2.7 — CL

## 2010-11-18 LAB — CARDIAC PANEL(CRET KIN+CKTOT+MB+TROPI)
CK, MB: 10.3 — ABNORMAL HIGH
CK, MB: 17.9 — ABNORMAL HIGH
Relative Index: 14.9 — ABNORMAL HIGH
Total CK: 113
Troponin I: 0.04

## 2010-11-18 LAB — IGG, IGA, IGM
IgA: 469 — ABNORMAL HIGH
IgG (Immunoglobin G), Serum: 1370
IgM, Serum: 101

## 2010-11-18 LAB — PROTEIN ELECTROPH W RFLX QUANT IMMUNOGLOBULINS
Albumin ELP: 43.8 — ABNORMAL LOW
Beta 2: 7 — ABNORMAL HIGH
Gamma Globulin: 22.8 — ABNORMAL HIGH

## 2010-11-18 LAB — LACTATE DEHYDROGENASE: LDH: 111

## 2010-11-18 LAB — DIFFERENTIAL
Basophils Absolute: 0
Eosinophils Relative: 0
Lymphocytes Relative: 2 — ABNORMAL LOW
Lymphocytes Relative: 5 — ABNORMAL LOW
Lymphs Abs: 0.7
Lymphs Abs: 0.9
Monocytes Absolute: 1.2 — ABNORMAL HIGH
Monocytes Relative: 2 — ABNORMAL LOW
Neutro Abs: 14.6 — ABNORMAL HIGH
Neutro Abs: 33.4 — ABNORMAL HIGH

## 2010-11-18 LAB — BODY FLUID CULTURE

## 2010-11-18 LAB — LIPID PANEL
Total CHOL/HDL Ratio: 3.9
VLDL: 25

## 2010-11-18 LAB — RETICULOCYTES
RBC.: 3.57 — ABNORMAL LOW
Retic Count, Absolute: 67.8
Retic Ct Pct: 1.9

## 2010-11-18 LAB — PATHOLOGIST SMEAR REVIEW

## 2010-11-18 LAB — LACTIC ACID, PLASMA: Lactic Acid, Venous: 0.9

## 2010-11-18 LAB — CORTISOL: Cortisol, Plasma: 26.3

## 2010-11-18 LAB — CK TOTAL AND CKMB (NOT AT ARMC)
CK, MB: 25.4 — ABNORMAL HIGH
Total CK: 162

## 2010-11-18 LAB — TECHNOLOGIST SMEAR REVIEW

## 2010-11-18 LAB — TROPONIN I: Troponin I: 0.03

## 2010-11-18 LAB — IMMUNOFIXATION ADD-ON

## 2010-11-18 LAB — FERRITIN: Ferritin: 314 — ABNORMAL HIGH (ref 10–291)

## 2010-11-18 LAB — FOLATE: Folate: 18.7

## 2010-11-18 LAB — HAPTOGLOBIN: Haptoglobin: 242 — ABNORMAL HIGH

## 2010-11-18 LAB — SODIUM, URINE, RANDOM: Sodium, Ur: 53

## 2010-11-18 LAB — LIPASE, BLOOD: Lipase: 123 — ABNORMAL HIGH

## 2010-11-18 LAB — VITAMIN B12: Vitamin B-12: 1161 — ABNORMAL HIGH (ref 211–911)

## 2011-07-21 ENCOUNTER — Other Ambulatory Visit: Payer: Self-pay | Admitting: Nephrology

## 2011-07-21 DIAGNOSIS — R921 Mammographic calcification found on diagnostic imaging of breast: Secondary | ICD-10-CM

## 2011-07-21 DIAGNOSIS — R922 Inconclusive mammogram: Secondary | ICD-10-CM

## 2011-08-09 ENCOUNTER — Ambulatory Visit
Admission: RE | Admit: 2011-08-09 | Discharge: 2011-08-09 | Disposition: A | Discharge status: Home / Self Care | Payer: Medicare Other | Source: Ambulatory Visit | Attending: Nephrology | Admitting: Nephrology

## 2011-08-09 DIAGNOSIS — R922 Inconclusive mammogram: Secondary | ICD-10-CM

## 2011-08-09 DIAGNOSIS — R921 Mammographic calcification found on diagnostic imaging of breast: Secondary | ICD-10-CM

## 2011-08-09 DIAGNOSIS — R9230 Dense breasts, unspecified: Secondary | ICD-10-CM

## 2011-08-23 ENCOUNTER — Other Ambulatory Visit: Payer: Self-pay | Admitting: Nephrology

## 2011-08-23 DIAGNOSIS — R7989 Other specified abnormal findings of blood chemistry: Secondary | ICD-10-CM

## 2011-08-31 ENCOUNTER — Ambulatory Visit
Admission: RE | Admit: 2011-08-31 | Discharge: 2011-08-31 | Disposition: A | Discharge status: Home / Self Care | Payer: Medicare Other | Source: Ambulatory Visit | Attending: Nephrology | Admitting: Nephrology

## 2011-08-31 DIAGNOSIS — R7989 Other specified abnormal findings of blood chemistry: Secondary | ICD-10-CM

## 2016-11-22 ENCOUNTER — Telehealth: Payer: Self-pay | Admitting: Gastroenterology

## 2016-11-22 ENCOUNTER — Encounter: Payer: Self-pay | Admitting: Physician Assistant

## 2016-11-22 NOTE — Telephone Encounter (Signed)
Patient can see APP at next appt available any time.

## 2016-11-22 NOTE — Telephone Encounter (Signed)
Spoke with patient states she needs to speak with her daughter to see when she can bring her because she cannot drive herself here. Patient states she will have her daughter call us back to schedule appointment. She was given our office phone number to call back.

## 2016-11-26 ENCOUNTER — Inpatient Hospital Stay (HOSPITAL_COMMUNITY)
Admission: EM | Admit: 2016-11-26 | Discharge: 2016-12-02 | DRG: 436 | Disposition: A | Discharge status: Home / Self Care | Payer: Medicare HMO | Attending: Family Medicine | Admitting: Family Medicine

## 2016-11-26 ENCOUNTER — Encounter (HOSPITAL_COMMUNITY): Payer: Self-pay

## 2016-11-26 ENCOUNTER — Emergency Department (HOSPITAL_COMMUNITY): Payer: Medicare HMO

## 2016-11-26 DIAGNOSIS — N185 Chronic kidney disease, stage 5: Secondary | ICD-10-CM

## 2016-11-26 DIAGNOSIS — Z515 Encounter for palliative care: Secondary | ICD-10-CM | POA: Diagnosis present

## 2016-11-26 DIAGNOSIS — K219 Gastro-esophageal reflux disease without esophagitis: Secondary | ICD-10-CM | POA: Diagnosis present

## 2016-11-26 DIAGNOSIS — D631 Anemia in chronic kidney disease: Secondary | ICD-10-CM | POA: Diagnosis present

## 2016-11-26 DIAGNOSIS — R17 Unspecified jaundice: Secondary | ICD-10-CM | POA: Diagnosis present

## 2016-11-26 DIAGNOSIS — Z880 Allergy status to penicillin: Secondary | ICD-10-CM

## 2016-11-26 DIAGNOSIS — K8021 Calculus of gallbladder without cholecystitis with obstruction: Secondary | ICD-10-CM | POA: Diagnosis present

## 2016-11-26 DIAGNOSIS — Z883 Allergy status to other anti-infective agents status: Secondary | ICD-10-CM

## 2016-11-26 DIAGNOSIS — D72829 Elevated white blood cell count, unspecified: Secondary | ICD-10-CM | POA: Diagnosis present

## 2016-11-26 DIAGNOSIS — N183 Chronic kidney disease, stage 3 (moderate): Secondary | ICD-10-CM | POA: Diagnosis present

## 2016-11-26 DIAGNOSIS — Z888 Allergy status to other drugs, medicaments and biological substances status: Secondary | ICD-10-CM | POA: Diagnosis not present

## 2016-11-26 DIAGNOSIS — R160 Hepatomegaly, not elsewhere classified: Secondary | ICD-10-CM

## 2016-11-26 DIAGNOSIS — C801 Malignant (primary) neoplasm, unspecified: Secondary | ICD-10-CM | POA: Diagnosis not present

## 2016-11-26 DIAGNOSIS — I10 Essential (primary) hypertension: Secondary | ICD-10-CM | POA: Diagnosis present

## 2016-11-26 DIAGNOSIS — R109 Unspecified abdominal pain: Secondary | ICD-10-CM | POA: Diagnosis not present

## 2016-11-26 DIAGNOSIS — Z79899 Other long term (current) drug therapy: Secondary | ICD-10-CM | POA: Diagnosis not present

## 2016-11-26 DIAGNOSIS — K8020 Calculus of gallbladder without cholecystitis without obstruction: Secondary | ICD-10-CM

## 2016-11-26 DIAGNOSIS — N289 Disorder of kidney and ureter, unspecified: Secondary | ICD-10-CM

## 2016-11-26 DIAGNOSIS — E875 Hyperkalemia: Secondary | ICD-10-CM | POA: Diagnosis not present

## 2016-11-26 DIAGNOSIS — T85528A Displacement of other gastrointestinal prosthetic devices, implants and grafts, initial encounter: Secondary | ICD-10-CM | POA: Diagnosis not present

## 2016-11-26 DIAGNOSIS — K269 Duodenal ulcer, unspecified as acute or chronic, without hemorrhage or perforation: Secondary | ICD-10-CM | POA: Diagnosis present

## 2016-11-26 DIAGNOSIS — K831 Obstruction of bile duct: Secondary | ICD-10-CM | POA: Diagnosis not present

## 2016-11-26 DIAGNOSIS — R1900 Intra-abdominal and pelvic swelling, mass and lump, unspecified site: Secondary | ICD-10-CM | POA: Diagnosis not present

## 2016-11-26 DIAGNOSIS — Z886 Allergy status to analgesic agent status: Secondary | ICD-10-CM | POA: Diagnosis not present

## 2016-11-26 DIAGNOSIS — Z66 Do not resuscitate: Secondary | ICD-10-CM

## 2016-11-26 DIAGNOSIS — N189 Chronic kidney disease, unspecified: Secondary | ICD-10-CM

## 2016-11-26 DIAGNOSIS — I129 Hypertensive chronic kidney disease with stage 1 through stage 4 chronic kidney disease, or unspecified chronic kidney disease: Secondary | ICD-10-CM | POA: Diagnosis present

## 2016-11-26 DIAGNOSIS — C221 Intrahepatic bile duct carcinoma: Principal | ICD-10-CM | POA: Diagnosis present

## 2016-11-26 DIAGNOSIS — Z7189 Other specified counseling: Secondary | ICD-10-CM

## 2016-11-26 DIAGNOSIS — C787 Secondary malignant neoplasm of liver and intrahepatic bile duct: Secondary | ICD-10-CM | POA: Diagnosis present

## 2016-11-26 DIAGNOSIS — R531 Weakness: Secondary | ICD-10-CM | POA: Diagnosis not present

## 2016-11-26 HISTORY — DX: Chronic kidney disease, unspecified: N18.9

## 2016-11-26 HISTORY — DX: Essential (primary) hypertension: I10

## 2016-11-26 HISTORY — DX: Other specified counseling: Z71.89

## 2016-11-26 LAB — COMPREHENSIVE METABOLIC PANEL
ALK PHOS: 798 U/L — AB (ref 38–126)
ALT: 429 U/L — AB (ref 14–54)
AST: 553 U/L — ABNORMAL HIGH (ref 15–41)
Albumin: 3.2 g/dL — ABNORMAL LOW (ref 3.5–5.0)
Anion gap: 17 — ABNORMAL HIGH (ref 5–15)
BUN: 65 mg/dL — ABNORMAL HIGH (ref 6–20)
CALCIUM: 9.7 mg/dL (ref 8.9–10.3)
CHLORIDE: 101 mmol/L (ref 101–111)
CO2: 14 mmol/L — ABNORMAL LOW (ref 22–32)
CREATININE: 2.46 mg/dL — AB (ref 0.44–1.00)
GFR, EST AFRICAN AMERICAN: 20 mL/min — AB (ref >60)
GFR, EST NON AFRICAN AMERICAN: 17 mL/min — AB (ref >60)
Glucose, Bld: 120 mg/dL — ABNORMAL HIGH (ref 65–99)
Potassium: 3.7 mmol/L (ref 3.5–5.1)
Sodium: 132 mmol/L — ABNORMAL LOW (ref 135–145)
TOTAL PROTEIN: 7.3 g/dL (ref 6.5–8.1)
Total Bilirubin: 10.3 mg/dL — ABNORMAL HIGH (ref 0.3–1.2)

## 2016-11-26 LAB — CBC
HCT: 30.6 % — ABNORMAL LOW (ref 36.0–46.0)
Hemoglobin: 10.5 g/dL — ABNORMAL LOW (ref 12.0–15.0)
MCH: 29.4 pg (ref 26.0–34.0)
MCHC: 34.3 g/dL (ref 30.0–36.0)
MCV: 85.7 fL (ref 78.0–100.0)
PLATELETS: 376 10*3/uL (ref 150–400)
RBC: 3.57 MIL/uL — AB (ref 3.87–5.11)
RDW: 15 % (ref 11.5–15.5)
WBC: 12 10*3/uL — AB (ref 4.0–10.5)

## 2016-11-26 LAB — APTT: APTT: 34 s (ref 24–36)

## 2016-11-26 LAB — URINALYSIS, ROUTINE W REFLEX MICROSCOPIC
Bilirubin Urine: NEGATIVE
Glucose, UA: NEGATIVE mg/dL
Hgb urine dipstick: NEGATIVE
Ketones, ur: NEGATIVE mg/dL
LEUKOCYTES UA: NEGATIVE
NITRITE: NEGATIVE
PROTEIN: NEGATIVE mg/dL
Specific Gravity, Urine: 1.01 (ref 1.005–1.030)
pH: 5 (ref 5.0–8.0)

## 2016-11-26 LAB — LACTATE DEHYDROGENASE: LDH: 455 U/L — ABNORMAL HIGH (ref 98–192)

## 2016-11-26 LAB — PROTIME-INR
INR: 1.2
PROTHROMBIN TIME: 15.1 s (ref 11.4–15.2)

## 2016-11-26 LAB — LIPASE, BLOOD: LIPASE: 213 U/L — AB (ref 11–51)

## 2016-11-26 LAB — ACETAMINOPHEN LEVEL: Acetaminophen (Tylenol), Serum: <10 ug/mL — ABNORMAL LOW (ref 10–30)

## 2016-11-26 LAB — I-STAT CG4 LACTIC ACID, ED: Lactic Acid, Venous: 1.25 mmol/L (ref 0.5–1.9)

## 2016-11-26 MED ORDER — SODIUM CHLORIDE 0.9 % IV BOLUS (SEPSIS)
1000.0000 mL | Freq: Once | INTRAVENOUS | Status: AC
Start: 2016-11-26 — End: 2016-11-26
  Administered 2016-11-26: 1000 mL via INTRAVENOUS

## 2016-11-26 MED ORDER — DEXTROSE-NACL 5-0.9 % IV SOLN
INTRAVENOUS | Status: DC
  Administered 2016-11-26: 17:00:00 via INTRAVENOUS

## 2016-11-26 MED ORDER — FENTANYL CITRATE (PF) 100 MCG/2ML IJ SOLN
50.0000 ug | INTRAMUSCULAR | Status: DC | PRN
  Administered 2016-11-26: 50 ug via INTRAVENOUS
  Filled 2016-11-26: qty 2

## 2016-11-26 MED ORDER — FENTANYL CITRATE (PF) 100 MCG/2ML IJ SOLN
25.0000 ug | INTRAMUSCULAR | Status: DC | PRN
  Administered 2016-11-26 – 2016-11-29 (×5): 25 ug via INTRAVENOUS
  Filled 2016-11-26 (×6): qty 2

## 2016-11-26 MED ORDER — IOPAMIDOL (ISOVUE-300) INJECTION 61%: 100.0000 mL | Freq: Once | INTRAVENOUS | Status: DC | PRN

## 2016-11-26 MED ORDER — ONDANSETRON HCL 4 MG/2ML IJ SOLN
4.0000 mg | Freq: Once | INTRAMUSCULAR | Status: AC | PRN
  Administered 2016-11-26: 4 mg via INTRAVENOUS
  Filled 2016-11-26: qty 2

## 2016-11-26 MED ORDER — ONDANSETRON HCL 4 MG PO TABS: 4.0000 mg | ORAL_TABLET | Freq: Four times a day (QID) | ORAL | Status: DC | PRN

## 2016-11-26 MED ORDER — ONDANSETRON HCL 4 MG/2ML IJ SOLN
4.0000 mg | Freq: Four times a day (QID) | INTRAMUSCULAR | Status: DC | PRN
  Administered 2016-11-26: 4 mg via INTRAVENOUS
  Filled 2016-11-26: qty 2

## 2016-11-26 NOTE — ED Notes (Signed)
Bed: TM21 Expected date:  Expected time:  Means of arrival:  Comments: 82 yo abd pain

## 2016-11-26 NOTE — Consult Note (Signed)
General Surgery [facility]  Reason for Consult: biliary obstruction, neoplasm of biliary tract, hepatic metastasis  Referring Physician: Mann[facility]; Dr. Hal Hope, [facility]  [name] is an 82 y.o. female.  HPI: patient is an 82 yo WF with nearly one year hx of declining health.  Significant weight loss.  Recent onset of back and abdominal pain, loss of appetite, and continued weight loss.  Now with acholic stools and dark urine.  On evaluation in ER, markedly elevated LFT's, Tbili, and lipase.  CTA with large mass in hepatic hilum with biliary obstruction, multiple gallstones, dilated gallbladder, and diffuse hepatic metastasis.  Admitted to medical service for initial management.  GI consult by Dr. Laurence Spates reviewed and plans for work up noted.  General surgery asked to evaluate.  Prior hysterectomy, hernia repair, bladder suspension with mesh.  Daughter at bedside.  Past Medical History:  Diagnosis Date  . Chronic kidney disease   . Hypertension     Past Surgical History:  Procedure Laterality Date  . ABDOMINAL HYSTERECTOMY    . APPENDECTOMY    . HERNIA REPAIR    . prolapsed bladder surgery    . TONSILLECTOMY      Family History  Problem Relation Age of Onset  . Rheumatic fever Mother   . Diabetes Mellitus II Father   . Ovarian cancer Sister     Social History:  reports that she has never smoked. She has never used smokeless tobacco. She reports that she does not drink alcohol or use drugs.  Allergies:  Allergies  Allergen Reactions  . Airborne [Multiple Vitamins-Minerals]   . Amlodipine Besylate     REACTION: depression, disorientation, parasthesias, headache, dizziness, rash on leg:  All after one dose.  Marland Kitchen Amoxicillin   . Bactrim [Sulfamethoxazole-Trimethoprim]   . Benicar [Olmesartan]   . Benzonatate   . Coreg [Carvedilol]   . Diltiazem Hcl     REACTION: Sleepy, headache  . Endal Cd [Phenyleph-Chlorphen-Codeine]   .  Fexofenadine   . Furosemide     REACTION: Leg pain, jerlky sensation  . Ibuprofen   . Lisinopril     REACTION: Very intolerant.  Was stopped per Dr. Hassell Done.  . Loratadine   . Losartan Potassium     REACTION: Itiching  . Monurol [Fosfomycin Tromethamine]   . Omeprazole   . Orudis [Ketoprofen]   . Penicillins     Has patient had a PCN reaction causing immediate rash, facial/tongue/throat swelling, SOB or lightheadedness with hypotension: Unknown Has patient had a PCN reaction causing severe rash involving mucus membranes or skin necrosis: Unknown Has patient had a PCN reaction that required hospitalization: Unknown Has patient had a PCN reaction occurring within the last 10 years: Unknown If all of the above answers are "NO", then may proceed with Cephalosporin use.   . Ranitidine Hcl     Medications: I have reviewed the patient's current medications.  Results for orders placed or performed during the hospital encounter of 11/26/16 (from the past 48 hour(s))  I-Stat CG4 Lactic Acid, ED     Status: None   Collection Time: 11/26/16 10:24 AM  Result Value Ref Range   Lactic Acid, Venous 1.25 0.5 - 1.9 mmol/L  Protime-INR     Status: None   Collection Time: 11/26/16 10:56 AM  Result Value Ref Range   Prothrombin Time 15.1 11.4 - 15.2 seconds   INR 1.20   APTT     Status: None   Collection Time: 11/26/16  10:56 AM  Result Value Ref Range   aPTT 34 24 - 36 seconds  Lactate dehydrogenase     Status: Abnormal   Collection Time: 11/26/16 10:56 AM  Result Value Ref Range   LDH 455 (H) 98 - 192 U/L  Acetaminophen level     Status: Abnormal   Collection Time: 11/26/16 10:56 AM  Result Value Ref Range   Acetaminophen (Tylenol), Serum <10 (L) 10 - 30 ug/mL    Comment:        THERAPEUTIC CONCENTRATIONS VARY SIGNIFICANTLY. A RANGE OF 10-30 ug/mL MAY BE AN EFFECTIVE CONCENTRATION FOR MANY PATIENTS. HOWEVER, SOME ARE BEST TREATED AT CONCENTRATIONS OUTSIDE THIS RANGE. ACETAMINOPHEN  CONCENTRATIONS >150 ug/mL AT 4 HOURS AFTER INGESTION AND >50 ug/mL AT 12 HOURS AFTER INGESTION ARE OFTEN ASSOCIATED WITH TOXIC REACTIONS.   Lipase, blood     Status: Abnormal   Collection Time: 11/26/16 11:33 AM  Result Value Ref Range   Lipase 213 (H) 11 - 51 U/L  Comprehensive metabolic panel     Status: Abnormal   Collection Time: 11/26/16 11:33 AM  Result Value Ref Range   Sodium 132 (L) 135 - 145 mmol/L   Potassium 3.7 3.5 - 5.1 mmol/L   Chloride 101 101 - 111 mmol/L   CO2 14 (L) 22 - 32 mmol/L   Glucose, Bld 120 (H) 65 - 99 mg/dL   BUN 65 (H) 6 - 20 mg/dL   Creatinine, Ser 0.83 (H) 0.44 - 1.00 mg/dL   Calcium 9.7 8.9 - 67.0 mg/dL   Total Protein 7.3 6.5 - 8.1 g/dL   Albumin 3.2 (L) 3.5 - 5.0 g/dL   AST 738 (H) 15 - 41 U/L   ALT 429 (H) 14 - 54 U/L   Alkaline Phosphatase 798 (H) 38 - 126 U/L   Total Bilirubin 10.3 (H) 0.3 - 1.2 mg/dL   GFR calc non Af Amer 17 (L) >60 mL/min   GFR calc Af Amer 20 (L) >60 mL/min    Comment: (NOTE) The eGFR has been calculated using the CKD EPI equation. This calculation has not been validated in all clinical situations. eGFR's persistently <60 mL/min signify possible Chronic Kidney Disease.    Anion gap 17 (H) 5 - 15  CBC     Status: Abnormal   Collection Time: 11/26/16 11:33 AM  Result Value Ref Range   WBC 12.0 (H) 4.0 - 10.5 K/uL   RBC 3.57 (L) 3.87 - 5.11 MIL/uL   Hemoglobin 10.5 (L) 12.0 - 15.0 g/dL   HCT 72.8 (L) 47.3 - 46.5 %   MCV 85.7 78.0 - 100.0 fL   MCH 29.4 26.0 - 34.0 pg   MCHC 34.3 30.0 - 36.0 g/dL   RDW 74.9 63.9 - 02.2 %   Platelets 376 150 - 400 K/uL  Urinalysis, Routine w reflex microscopic     Status: None   Collection Time: 11/26/16  1:45 PM  Result Value Ref Range   Color, Urine YELLOW YELLOW   APPearance CLEAR CLEAR   Specific Gravity, Urine 1.010 1.005 - 1.030   pH 5.0 5.0 - 8.0   Glucose, UA NEGATIVE NEGATIVE mg/dL   Hgb urine dipstick NEGATIVE NEGATIVE   Bilirubin Urine NEGATIVE NEGATIVE    Ketones, ur NEGATIVE NEGATIVE mg/dL   Protein, ur NEGATIVE NEGATIVE mg/dL   Nitrite NEGATIVE NEGATIVE   Leukocytes, UA NEGATIVE NEGATIVE    Ct Abdomen Pelvis Wo Contrast  Result Date: 11/26/2016 CLINICAL DATA:  Abdominal pain for a week. EXAM: CT  ABDOMEN AND PELVIS WITHOUT CONTRAST TECHNIQUE: Multidetector CT imaging of the abdomen and pelvis was performed following the standard protocol without IV contrast. COMPARISON:  07/13/2009 pelvis CT. FINDINGS: Lower chest: Calcified granulomas in the right lower lobe. Small to moderate sliding hiatal hernia. Hepatobiliary: Severe intra and extrahepatic bile duct dilatation. There are superimposed subtle low-density masses throughout the liver.There is bulky masslike appearance at the hepatic hilum and upper gallbladder fossa where the gallbladder neck and the common bile duct are not visible. Cholelithiasis. The gallbladder is markedly dilated without inflammatory wall thickening. Pancreas: The pancreas appears displaced by the mass. No main duct dilatation to suggest primary pancreatic lesion. Spleen: Granulomatous calcifications. Adrenals/Urinary Tract: Negative adrenals. Atrophic kidneys with presumed cystic densities. No hydronephrosis. Unremarkable bladder. Stomach/Bowel:  No obstruction. No inflammatory changes. Vascular/Lymphatic: Soft tissue density at the hepatic hilum is likely both primary mass and adenopathy. No distant adenopathy in the deep liver drainage. Mild atherosclerotic calcification for age. Reproductive:Hysterectomy Other: No ascites or pneumoperitoneum. Musculoskeletal: No acute abnormalities. Remote L3 and L4 compression fractures. Spinal degeneration and mild scoliosis. Osteopenia. IMPRESSION: Large mass at the hepatic hilum/gallbladder fossa, favor cholangiocarcinoma. There is severe bile duct and gallbladder obstruction. Multiple ill-defined low-density masses throughout the liver attributed to metastatic disease. Electronically Signed    By: Monte Fantasia M.D.   On: 11/26/2016 13:04    Review of Systems  Constitutional: Positive for malaise/fatigue and weight loss. Negative for chills and fever.  HENT: Negative.   Eyes:       Scleral icterus  Respiratory: Negative.   Cardiovascular: Negative.   Gastrointestinal: Positive for abdominal pain, diarrhea, heartburn, nausea and vomiting.  Genitourinary: Positive for dysuria and flank pain.  Musculoskeletal: Positive for back pain.  Skin:       jaundice  Neurological: Positive for weakness.  Endo/Heme/Allergies: Negative.   Psychiatric/Behavioral: Negative.    Blood pressure (!) 176/73, pulse 86, temperature 98.2 F (36.8 C), temperature source Oral, resp. rate 20, height '4\' 10"'$  (1.473 m), weight 50 kg (110 lb 3.7 oz), SpO2 100 %. Physical Exam  Constitutional: She is oriented to person, place, and time. No distress.  Thin, frail appearing WF in bed on 3 [place]; daughter at bedside  HENT:  Head: Normocephalic and atraumatic.  Right Ear: External ear normal.  Left Ear: External ear normal.  Mouth/Throat: No oropharyngeal exudate.  Eyes: Pupils are equal, round, and reactive to light. Conjunctivae are normal. Right eye exhibits no discharge. Left eye exhibits no discharge. Scleral icterus is present.  Neck: Normal range of motion. Neck supple. No tracheal deviation present. No thyromegaly present.  Cardiovascular: Normal rate, regular rhythm and normal heart sounds.   No murmur heard. Respiratory: Effort normal and breath sounds normal. No respiratory distress. She has no wheezes.  GI: Soft. Bowel sounds are normal. She exhibits distension. She exhibits no mass. There is no tenderness. There is no rebound and no guarding.  Musculoskeletal: Normal range of motion. She exhibits no edema, tenderness or deformity.  Neurological: She is alert and oriented to person, place, and time.  Skin: Skin is warm and dry. She is not diaphoretic.  jaundiced  Psychiatric: She has a  normal mood and affect. Her behavior is normal.    Assessment/Plan: Neoplasm in hepatic hilum with biliary obstruction, multiple hepatic metastases  Agree with medical admission  Agree with imaging and evaluation by GI & IR for decompression of the biliary tree with either stent or PTC  Agree with plans for endoscopic / EUS biopsy  for tissue diagnosis  Suspect little to be offered from surgical standpoint.  Will ask Dr. Stark Klein to review studies when available.  Armandina Gemma, [facility] Office: [place] 11/26/2016, 6:18 PM

## 2016-11-26 NOTE — ED Notes (Signed)
Patient transported to CT 

## 2016-11-26 NOTE — Consult Note (Signed)
[facility] Reason for consult:jaundice Referring Physician: [facility]. PCP: none patient goes to urgent care centers. Primary G.I.: none patient is unassigned.  [name] is an 82 y.o. female.  HPI: she has not been to a physician on chronic basis for a number of years. She's had a history of urinary tract infections and hypertension. She's had hysterectomy and appendectomy in the past but is not required chronic treatment. She reports that she has not been feeling well for the past 2 to 3 months. She went to an urgent care center recently and apparently had some abnormal labs and was referred to a [facility] in town but the appointment was scheduled in several weeks She continued to have symptoms of vague abdominal pain without fever or chills with some nausea. She has lost some weight. Her daughters were with her today state that her urine turned dark about 2 to 3 days ago and that is when they took her to the urgent care center over concerned that she may have urinary tract infection.she began to feel worse in her eyes turned slightly yellowand they brought her to the emergency room. She was found to be markedly jaundice with the bilirubin of 10 and alkaline phosphatase of 800. Lipase was elevated it to 13. A CT scan was obtained in this revealed a massive gallbladder with a bulky mass gallbladder fossa in the liver hilum with Mark dilated intrauterine extrahepatic bile ducts. The pancreas appeared to be displaced but did not have any pancreatic duct dilatation noted. The CT interpretation was gallstones with markedly distended gallbladder and amass in the gallbladder fossa probable cholangiocarcinoma with Mets through the liver.  Past Medical History:  Diagnosis Date  . Chronic kidney disease   . Hypertension     Past Surgical History:  Procedure Laterality Date  . ABDOMINAL HYSTERECTOMY    . APPENDECTOMY    . HERNIA REPAIR    . prolapsed bladder surgery    .  TONSILLECTOMY      Family History  Problem Relation Age of Onset  . Rheumatic fever Mother   . Diabetes Mellitus II Father   . Ovarian cancer Sister     Social History:  reports that she has never smoked. She has never used smokeless tobacco. She reports that she does not drink alcohol or use drugs.  Allergies:  Allergies  Allergen Reactions  . Airborne [Multiple Vitamins-Minerals]   . Amlodipine Besylate     REACTION: depression, disorientation, parasthesias, headache, dizziness, rash on leg:  All after one dose.  Marland Kitchen Amoxicillin   . Bactrim [Sulfamethoxazole-Trimethoprim]   . Benicar [Olmesartan]   . Benzonatate   . Coreg [Carvedilol]   . Diltiazem Hcl     REACTION: Sleepy, headache  . Endal Cd [Phenyleph-Chlorphen-Codeine]   . Fexofenadine   . Furosemide     REACTION: Leg pain, jerlky sensation  . Ibuprofen   . Lisinopril     REACTION: Very intolerant.  Was stopped per Dr. Hassell Done.  . Loratadine   . Losartan Potassium     REACTION: Itiching  . Monurol [Fosfomycin Tromethamine]   . Omeprazole   . Orudis [Ketoprofen]   . Penicillins     Has patient had a PCN reaction causing immediate rash, facial/tongue/throat swelling, SOB or lightheadedness with hypotension: Unknown Has patient had a PCN reaction causing severe rash involving mucus membranes or skin necrosis: Unknown Has patient had a PCN reaction that required hospitalization: Unknown Has patient had a PCN reaction occurring within  the last 10 years: Unknown If all of the above answers are "NO", then may proceed with Cephalosporin use.   . Ranitidine Hcl     Medications; Prior to Admission medications   Medication Sig Start Date End Date Taking? Authorizing Provider  acetaminophen (TYLENOL) 500 MG tablet Take 500 mg by mouth 2 (two) times daily.   Yes [provider]  Calcium-Magnesium-Vitamin D (CALCIUM 1200+D3 PO) Take 2 tablets by mouth daily.   Yes [provider]  co-enzyme Q-10 30 MG capsule  Take 30 mg by mouth daily.   Yes [provider]  Garlic 381 MG CAPS Take 2 capsules by mouth daily.   Yes [provider]  glucosamine-chondroitin 500-400 MG tablet Take 2 tablets by mouth daily.   Yes [provider]  LECITHIN PO Take 1 capsule by mouth daily.   Yes [provider]  Multiple Vitamin (MULTIVITAMIN WITH MINERALS) TABS tablet Take 1 tablet by mouth daily.   Yes [provider]  thiamine (VITAMIN B-1) 100 MG tablet Take 100 mg by mouth daily.   Yes [provider]  vitamin E 200 UNIT capsule Take 200 Units by mouth daily.   Yes [provider]    PRN Meds fentaNYL (SUBLIMAZE) injection, ondansetron **OR** ondansetron (ZOFRAN) IV Results for orders placed or performed during the hospital encounter of 11/26/16 (from the past 48 hour(s))  I-Stat CG4 Lactic Acid, ED     Status: None   Collection Time: 11/26/16 10:24 AM  Result Value Ref Range   Lactic Acid, Venous 1.25 0.5 - 1.9 mmol/L  Protime-INR     Status: None   Collection Time: 11/26/16 10:56 AM  Result Value Ref Range   Prothrombin Time 15.1 11.4 - 15.2 seconds   INR 1.20   APTT     Status: None   Collection Time: 11/26/16 10:56 AM  Result Value Ref Range   aPTT 34 24 - 36 seconds  Lactate dehydrogenase     Status: Abnormal   Collection Time: 11/26/16 10:56 AM  Result Value Ref Range   LDH 455 (H) 98 - 192 U/L  Acetaminophen level     Status: Abnormal   Collection Time: 11/26/16 10:56 AM  Result Value Ref Range   Acetaminophen (Tylenol), Serum <10 (L) 10 - 30 ug/mL    Comment:        THERAPEUTIC CONCENTRATIONS VARY SIGNIFICANTLY. A RANGE OF 10-30 ug/mL MAY BE AN EFFECTIVE CONCENTRATION FOR MANY PATIENTS. HOWEVER, SOME ARE BEST TREATED AT CONCENTRATIONS OUTSIDE THIS RANGE. ACETAMINOPHEN CONCENTRATIONS >150 ug/mL AT 4 HOURS AFTER INGESTION AND >50 ug/mL AT 12 HOURS AFTER INGESTION ARE OFTEN ASSOCIATED WITH TOXIC REACTIONS.   Lipase, blood      Status: Abnormal   Collection Time: 11/26/16 11:33 AM  Result Value Ref Range   Lipase 213 (H) 11 - 51 U/L  Comprehensive metabolic panel     Status: Abnormal   Collection Time: 11/26/16 11:33 AM  Result Value Ref Range   Sodium 132 (L) 135 - 145 mmol/L   Potassium 3.7 3.5 - 5.1 mmol/L   Chloride 101 101 - 111 mmol/L   CO2 14 (L) 22 - 32 mmol/L   Glucose, Bld 120 (H) 65 - 99 mg/dL   BUN 65 (H) 6 - 20 mg/dL   Creatinine, Ser 2.46 (H) 0.44 - 1.00 mg/dL   Calcium 9.7 8.9 - 10.3 mg/dL   Total Protein 7.3 6.5 - 8.1 g/dL   Albumin 3.2 (L) 3.5 - 5.0 g/dL  AST 553 (H) 15 - 41 U/L   ALT 429 (H) 14 - 54 U/L   Alkaline Phosphatase 798 (H) 38 - 126 U/L   Total Bilirubin 10.3 (H) 0.3 - 1.2 mg/dL   GFR calc non Af Amer 17 (L) >60 mL/min   GFR calc Af Amer 20 (L) >60 mL/min    Comment: (NOTE) The eGFR has been calculated using the CKD EPI equation. This calculation has not been validated in all clinical situations. eGFR's persistently <60 mL/min signify possible Chronic Kidney Disease.    Anion gap 17 (H) 5 - 15  CBC     Status: Abnormal   Collection Time: 11/26/16 11:33 AM  Result Value Ref Range   WBC 12.0 (H) 4.0 - 10.5 K/uL   RBC 3.57 (L) 3.87 - 5.11 MIL/uL   Hemoglobin 10.5 (L) 12.0 - 15.0 g/dL   HCT 41.6 (L) 00.0 - 29.7 %   MCV 85.7 78.0 - 100.0 fL   MCH 29.4 26.0 - 34.0 pg   MCHC 34.3 30.0 - 36.0 g/dL   RDW 04.9 11.4 - 35.5 %   Platelets 376 150 - 400 K/uL  Urinalysis, Routine w reflex microscopic     Status: None   Collection Time: 11/26/16  1:45 PM  Result Value Ref Range   Color, Urine YELLOW YELLOW   APPearance CLEAR CLEAR   Specific Gravity, Urine 1.010 1.005 - 1.030   pH 5.0 5.0 - 8.0   Glucose, UA NEGATIVE NEGATIVE mg/dL   Hgb urine dipstick NEGATIVE NEGATIVE   Bilirubin Urine NEGATIVE NEGATIVE   Ketones, ur NEGATIVE NEGATIVE mg/dL   Protein, ur NEGATIVE NEGATIVE mg/dL   Nitrite NEGATIVE NEGATIVE   Leukocytes, UA NEGATIVE NEGATIVE    Ct Abdomen Pelvis Wo  Contrast  Result Date: 11/26/2016 CLINICAL DATA:  Abdominal pain for a week. EXAM: CT ABDOMEN AND PELVIS WITHOUT CONTRAST TECHNIQUE: Multidetector CT imaging of the abdomen and pelvis was performed following the standard protocol without IV contrast. COMPARISON:  07/13/2009 pelvis CT. FINDINGS: Lower chest: Calcified granulomas in the right lower lobe. Small to moderate sliding hiatal hernia. Hepatobiliary: Severe intra and extrahepatic bile duct dilatation. There are superimposed subtle low-density masses throughout the liver.There is bulky masslike appearance at the hepatic hilum and upper gallbladder fossa where the gallbladder neck and the common bile duct are not visible. Cholelithiasis. The gallbladder is markedly dilated without inflammatory wall thickening. Pancreas: The pancreas appears displaced by the mass. No main duct dilatation to suggest primary pancreatic lesion. Spleen: Granulomatous calcifications. Adrenals/Urinary Tract: Negative adrenals. Atrophic kidneys with presumed cystic densities. No hydronephrosis. Unremarkable bladder. Stomach/Bowel:  No obstruction. No inflammatory changes. Vascular/Lymphatic: Soft tissue density at the hepatic hilum is likely both primary mass and adenopathy. No distant adenopathy in the deep liver drainage. Mild atherosclerotic calcification for age. Reproductive:Hysterectomy Other: No ascites or pneumoperitoneum. Musculoskeletal: No acute abnormalities. Remote L3 and L4 compression fractures. Spinal degeneration and mild scoliosis. Osteopenia. IMPRESSION: Large mass at the hepatic hilum/gallbladder fossa, favor cholangiocarcinoma. There is severe bile duct and gallbladder obstruction. Multiple ill-defined low-density masses throughout the liver attributed to metastatic disease. Electronically Signed   By: Marnee Spring M.D.   On: 11/26/2016 13:04               Blood pressure (!) 176/73, pulse 86, temperature 98.2 F (36.8 C), temperature source  Oral, resp. rate 20, height 4\' 10"  (1.473 m), weight 50 kg (110 lb 3.7 oz), SpO2 100 %.  Physical exam:   General--thin white  female in no acute distress ENT--sclera are icteric Neck--full range of motion Heart--regular rate and rhythm without murmurs are gallops Lungs--clear Abdomen--distended and diffuse tenderness primarily in the upper abdomen. There seems to be a fullness in the right upper quadrant but she is guarding somewhat in this area. Bowel sounds are present Psych--alert and oriented answers questions appropriately and seems appropriately anxious about her diagnosis.   Assessment: 1. Jaundice due to biliary obstruction probably due to some type of tumor in the gallbladder fossa compressing the external biliary system. The patient will need further testing to obtain a diagnosis and will need relief of her biliary obstruction. I have discussed this in detail with the patient and her 2 daughters.    Plan: 1. We will go ahead and order CA 19 9 and CEA in the morning. 2. Will start off and obtain an MRI of the liver and biliary system to try to establish the exact level of obstruction. 3. Patient will likely need ERCP with placement of biliary stent or PTC and external drainage. EUS with biopsy is also possible tests that will be needed. I have discussed all of these briefly with the patient and her 2 daughters. We will start off with the MRCP.   Annalyssa Thune JR,Fabyan Loughmiller L 11/26/2016, 5:29 PM   This note was created using voice recognition software and minor errors may Have occurred unintentionally. Pager: [phone] If no answer or after hours call [phone]

## 2016-11-26 NOTE — ED Provider Notes (Signed)
[facility] DEPT Provider Note   CSN: [id number] Arrival date & time: 11/26/16  8469     History   Chief Complaint Chief Complaint  Patient presents with  . Abdominal Pain    HPI [name] is a 82 y.o. female.  HPI  [name] is a 82 y.o. female history of hypertension, diverticulosis, renal insufficiency, anemia of chronic disease, presents to emergency department complaining of abdominal pain.  Patient has had progressively worsening abdominal pain over the last several months.  Apparently went to urgent care several days ago and on their x-ray was noted that she may have a mass in the right upper quadrant.  Patient's gastroenterologist was contacted, however has not been seen yet.  Patient denies any nausea or vomiting, however is spitting out "mucus."  Patient denies any fever or chills.  No urinary symptoms.  Denies any chest pain but does have some upper abdomen/lower chest tightness with abdominal pain.   No past medical history on file.  Patient Active Problem List   Diagnosis Date Noted  . HYPERTENSION 06/04/2008  . DIVERTICULOSIS, ASYMPTOMATIC 06/04/2008  . INSOMNIA 06/04/2008  . ELEVATED BLOOD PRESSURE 10/11/2007  . RASH AND OTHER NONSPECIFIC SKIN ERUPTION 06/28/2007  . HYPERPARATHYROIDISM, SECONDARY 11/30/2006  . UTI 11/30/2006  . DYSURIA 11/29/2006  . HYPOGLYCEMIA NOS 08/28/2006  . HYPERPOTASSEMIA 08/28/2006  . ANEMIA IN CHRONIC KIDNEY DISEASE 08/28/2006  . RENAL FAILURE, ACUTE 08/28/2006  . RENAL FAILURE 08/28/2006  . HYDRONEPHROSIS 08/28/2006  . OBSTRUCTION, URINARY NOS 08/28/2006  . PROLAPSE, VAGINAL WALL, CYSTOCELE, MIDLINE 08/28/2006  . PRURITIC DISORDER NOS 08/28/2006  . Lumbago 08/28/2006  . DIARRHEA 08/28/2006  . DEBILITY NOS 08/28/2006    No past surgical history on file.  OB History    No data available       Home Medications    Prior to Admission medications   Not on File    Family  History No family history on file.  Social History Social History  Substance Use Topics  . Smoking status: Never Smoker  . Smokeless tobacco: Never Used  . Alcohol use No     Allergies   Amlodipine besylate; Amoxicillin; Diltiazem hcl; Fexofenadine; Furosemide; Ibuprofen; Lisinopril; Loratadine; Losartan potassium; Omeprazole; Penicillins; and Ranitidine hcl   Review of Systems Review of Systems  Constitutional: Negative for chills and fever.  Respiratory: Negative for cough, chest tightness and shortness of breath.   Cardiovascular: Negative for chest pain, palpitations and leg swelling.  Gastrointestinal: Positive for abdominal pain. Negative for diarrhea, nausea and vomiting.  Genitourinary: Negative for dysuria, flank pain and pelvic pain.  Musculoskeletal: Negative for arthralgias, myalgias, neck pain and neck stiffness.  Skin: Negative for rash.  Neurological: Negative for dizziness, weakness and headaches.  All other systems reviewed and are negative.    Physical Exam Updated Vital Signs Ht '4\' 10"'$  (1.473 m)   Wt 48.1 kg (106 lb)   SpO2 100%   BMI 22.15 kg/m   Physical Exam  Constitutional: She appears well-developed and well-nourished. No distress.  HENT:  Head: Normocephalic.  Eyes: Conjunctivae are normal. Scleral icterus is present.  Neck: Neck supple.  Cardiovascular: Normal rate, regular rhythm and normal heart sounds.   Pulmonary/Chest: Effort normal and breath sounds normal. No respiratory distress. She has no wheezes. She has no rales.  Abdominal: Soft. Bowel sounds are normal. She exhibits no distension. There is tenderness. There is no rebound.  Diffuse tenderness. Fullness/mass in the right upper and mid abdomen.  TTP.   Musculoskeletal: She exhibits no edema.  Neurological: She is alert.  Skin: Skin is warm and dry.  jaundiced  Psychiatric: She has a normal mood and affect. Her behavior is normal.  Nursing note and vitals reviewed.    ED  Treatments / Results  Labs (all labs ordered are listed, but only abnormal results are displayed) Labs Reviewed  LIPASE, BLOOD - Abnormal; Notable for the following:       Result Value   Lipase 213 (*)    All other components within normal limits  COMPREHENSIVE METABOLIC PANEL - Abnormal; Notable for the following:    Sodium 132 (*)    CO2 14 (*)    Glucose, Bld 120 (*)    BUN 65 (*)    Creatinine, Ser 2.46 (*)    Albumin 3.2 (*)    AST 553 (*)    ALT 429 (*)    Alkaline Phosphatase 798 (*)    Total Bilirubin 10.3 (*)    GFR calc non Af Amer 17 (*)    GFR calc Af Amer 20 (*)    Anion gap 17 (*)    All other components within normal limits  CBC - Abnormal; Notable for the following:    WBC 12.0 (*)    RBC 3.57 (*)    Hemoglobin 10.5 (*)    HCT 30.6 (*)    All other components within normal limits  LACTATE DEHYDROGENASE - Abnormal; Notable for the following:    LDH 455 (*)    All other components within normal limits  ACETAMINOPHEN LEVEL - Abnormal; Notable for the following:    Acetaminophen (Tylenol), Serum <10 (*)    All other components within normal limits  URINALYSIS, ROUTINE W REFLEX MICROSCOPIC  PROTIME-INR  APTT  I-STAT CG4 LACTIC ACID, ED    EKG  EKG Interpretation  Date/Time:  Saturday November 26 2016 11:15:16 EDT Ventricular Rate:  81 PR Interval:    QRS Duration: 92 QT Interval:  394 QTC Calculation: 458 R Axis:   -6 Text Interpretation:  Sinus rhythm Atrial premature complex Confirmed by Lacretia Leigh ([id number]) on 11/26/2016 2:06:39 PM       Radiology Ct Abdomen Pelvis Wo Contrast  Result Date: 11/26/2016 CLINICAL DATA:  Abdominal pain for a week. EXAM: CT ABDOMEN AND PELVIS WITHOUT CONTRAST TECHNIQUE: Multidetector CT imaging of the abdomen and pelvis was performed following the standard protocol without IV contrast. COMPARISON:  07/13/2009 pelvis CT. FINDINGS: Lower chest: Calcified granulomas in the right lower lobe. Small to moderate sliding  hiatal hernia. Hepatobiliary: Severe intra and extrahepatic bile duct dilatation. There are superimposed subtle low-density masses throughout the liver.There is bulky masslike appearance at the hepatic hilum and upper gallbladder fossa where the gallbladder neck and the common bile duct are not visible. Cholelithiasis. The gallbladder is markedly dilated without inflammatory wall thickening. Pancreas: The pancreas appears displaced by the mass. No main duct dilatation to suggest primary pancreatic lesion. Spleen: Granulomatous calcifications. Adrenals/Urinary Tract: Negative adrenals. Atrophic kidneys with presumed cystic densities. No hydronephrosis. Unremarkable bladder. Stomach/Bowel:  No obstruction. No inflammatory changes. Vascular/Lymphatic: Soft tissue density at the hepatic hilum is likely both primary mass and adenopathy. No distant adenopathy in the deep liver drainage. Mild atherosclerotic calcification for age. Reproductive:Hysterectomy Other: No ascites or pneumoperitoneum. Musculoskeletal: No acute abnormalities. Remote L3 and L4 compression fractures. Spinal degeneration and mild scoliosis. Osteopenia. IMPRESSION: Large mass at the hepatic hilum/gallbladder fossa, favor cholangiocarcinoma. There is severe bile duct and gallbladder obstruction. Multiple  ill-defined low-density masses throughout the liver attributed to metastatic disease. Electronically Signed   By: Monte Fantasia M.D.   On: 11/26/2016 13:04    Procedures Procedures (including critical care time)  Medications Ordered in ED Medications  ondansetron (ZOFRAN) injection 4 mg (not administered)     Initial Impression / Assessment and Plan / ED Course  I have reviewed the triage vital signs and the nursing notes.  Pertinent labs & imaging results that were available during my care of the patient were reviewed by me and considered in my medical decision making (see chart for details).    Patient with abdominal pain, nausea  vomiting, pain for several months, worsened in the last few days.  On exam, palpable mass in the right mid abdomen.  Diffuse tenderness.  Patient is very jaundiced.  Will get labs, will get CT abdomen and pelvis.    Patient's labs show bilirubin of 10.3, elevated AST and ALT of 553 and 429, elevated alk phos, creatinine is 2.46 at baseline.  Lipase is 213.  CT abdomen pelvis pending.  IV fluids ordered, will give pain medications for pain, already received Zofran for nausea vomiting.    Pt with large mass on CT, with biliary obstruction. Spoke with pt and family discussed results and plan of admission for further treatment and testing. Spoke with admitting physician, asked for surgery consult.   2:13 PM Spoke with Dr. Harlow Asa, [place], will consult, recommended consulting GI as well.   2:39 PM Spoke with Dr. Oletta Lamas, GI will consult  Vitals:   11/26/16 1150 11/26/16 1156 11/26/16 1200 11/26/16 1230  BP:  (!) 156/78 (!) 162/72 (!) 176/64  Pulse: 76 78 82 89  Resp:  16 (!) 24 19  Temp:  97.9 F (36.6 C)    TempSrc:  Oral    SpO2: 98% 97% 95% 98%  Weight:      Height:         Final Clinical Impressions(s) / ED Diagnoses   Final diagnoses:  Abdominal mass, unspecified abdominal location  Biliary obstruction  Jaundice  Renal insufficiency    New Prescriptions New Prescriptions   No medications on file     Jeannett Senior, PA-C 11/26/16 1446

## 2016-11-26 NOTE — ED Notes (Signed)
Patient trying to give urine sample also had a bowel movement in the sample

## 2016-11-26 NOTE — Progress Notes (Signed)
Pt. arrived to floor from ED via stretcher. Daughter at bedside, no respiratory distress noted

## 2016-11-26 NOTE — ED Provider Notes (Signed)
Medical screening examination/treatment/procedure(s) were conducted as a shared visit with non-physician practitioner(s) and myself.  I personally evaluated the patient during the encounter.   EKG Interpretation  Date/Time:  Saturday November 26 2016 11:15:16 EDT Ventricular Rate:  81 PR Interval:    QRS Duration: 92 QT Interval:  394 QTC Calculation: 458 R Axis:   -6 Text Interpretation:  Sinus rhythm Atrial premature complex Confirmed by Lacretia Leigh ([id number]) on 11/26/2016 2:06:52 PM     82 year old female presents with abdominal pain times 1 week with some associated jaundice.  On exam patient does have scleral icterus.  Abdominal CT consistent with mass.  Will admit to the medicine service with consultation to general surgery   Lacretia Leigh, MD 11/26/16 1407

## 2016-11-26 NOTE — H&P (Signed)
History and Physical    IRAN KIEVIT SJG:[id number] DOB: [birth year] DOA: 11/26/2016  PCP: Patient, No Pcp Per  Patient coming from: Home.  Chief Complaint: Abdominal pain.  HPI: [name] is a 82 y.o. female with history of hypertension and chronic kidney disease and anemia who has not been to a doctor for many years presents to the ER with complaints of worsening abdominal pain.  Patient states she has been doing abdominal pain for the last 2-3 months which has acutely worsened last 1 week.  Patient also has been having nausea vomiting.  Pain is mostly in the mid abdomen radiating to the back and chest.  Denies any blood in the vomitus.  Has had bowel movement last one today.  Patient also noticed jaundice.  Denies fever chills.  In 2008 patient had severe sepsis from pyelonephritis and obstructive uropathy from prolapsed bladder and uterus.  ED Course: In the ER patient's labs reveal lipase of 213 AST 553 ALT 429 total bilirubin was 10.3 with creatinine of 2.4 bicarb of 14.  Patient had jaundice with abdominal pain so CT of the abdomen and pelvis was done which showed gallbladder fossa mass with severe obstruction of the bile ducts and gallbladder.  There also was multiple densities in the liver concerning for metastatic disease.  Review of Systems: As per HPI, rest all negative.   Past Medical History:  Diagnosis Date  . Chronic kidney disease   . Hypertension     Past Surgical History:  Procedure Laterality Date  . ABDOMINAL HYSTERECTOMY    . APPENDECTOMY    . HERNIA REPAIR    . prolapsed bladder surgery    . TONSILLECTOMY       reports that she has never smoked. She has never used smokeless tobacco. She reports that she does not drink alcohol or use drugs.  Allergies  Allergen Reactions  . Airborne [Multiple Vitamins-Minerals]   . Amlodipine Besylate     REACTION: depression, disorientation, parasthesias, headache, dizziness, rash on leg:  All after one dose.    Marland Kitchen Amoxicillin   . Bactrim [Sulfamethoxazole-Trimethoprim]   . Benicar [Olmesartan]   . Benzonatate   . Coreg [Carvedilol]   . Diltiazem Hcl     REACTION: Sleepy, headache  . Endal Cd [Phenyleph-Chlorphen-Codeine]   . Fexofenadine   . Furosemide     REACTION: Leg pain, jerlky sensation  . Ibuprofen   . Lisinopril     REACTION: Very intolerant.  Was stopped per Dr. Hassell Done.  . Loratadine   . Losartan Potassium     REACTION: [place]  . Monurol [Fosfomycin Tromethamine]   . Omeprazole   . Orudis [Ketoprofen]   . Penicillins     Has patient had a PCN reaction causing immediate rash, facial/tongue/throat swelling, SOB or lightheadedness with hypotension: Unknown Has patient had a PCN reaction causing severe rash involving mucus membranes or skin necrosis: Unknown Has patient had a PCN reaction that required hospitalization: Unknown Has patient had a PCN reaction occurring within the last 10 years: Unknown If all of the above answers are "NO", then may proceed with Cephalosporin use.   . Ranitidine Hcl     Family History  Problem Relation Age of Onset  . Rheumatic fever Mother   . Diabetes Mellitus II Father   . Ovarian cancer Sister     Prior to Admission medications   Medication Sig Start Date End Date Taking? Authorizing Provider  acetaminophen (TYLENOL) 500 MG tablet Take 500 mg by  mouth 2 (two) times daily.   Yes [provider]  Calcium-Magnesium-Vitamin D (CALCIUM 1200+D3 PO) Take 2 tablets by mouth daily.   Yes [provider]  co-enzyme Q-10 30 MG capsule Take 30 mg by mouth daily.   Yes [provider]  Garlic 811 MG CAPS Take 2 capsules by mouth daily.   Yes [provider]  glucosamine-chondroitin 500-400 MG tablet Take 2 tablets by mouth daily.   Yes [provider]  LECITHIN PO Take 1 capsule by mouth daily.   Yes [provider]  Multiple Vitamin (MULTIVITAMIN WITH MINERALS) TABS tablet Take 1 tablet by mouth  daily.   Yes [provider]  thiamine (VITAMIN B-1) 100 MG tablet Take 100 mg by mouth daily.   Yes [provider]  vitamin E 200 UNIT capsule Take 200 Units by mouth daily.   Yes [provider]    Physical Exam: Vitals:   11/26/16 1150 11/26/16 1156 11/26/16 1200 11/26/16 1230  BP:  (!) 156/78 (!) 162/72 (!) 176/64  Pulse: 76 78 82 89  Resp:  16 (!) 24 19  Temp:  97.9 F (36.6 C)    TempSrc:  Oral    SpO2: 98% 97% 95% 98%  Weight:      Height:          Constitutional: Moderately built poorly nourished. Vitals:   11/26/16 1150 11/26/16 1156 11/26/16 1200 11/26/16 1230  BP:  (!) 156/78 (!) 162/72 (!) 176/64  Pulse: 76 78 82 89  Resp:  16 (!) 24 19  Temp:  97.9 F (36.6 C)    TempSrc:  Oral    SpO2: 98% 97% 95% 98%  Weight:      Height:       Eyes: Icterus present mild pallor. ENMT: No discharge from the ears eyes nose or mouth. Neck: No mass felt.  No neck rigidity. Respiratory: No rhonchi or crepitations. Cardiovascular: S1-S2 heard no murmurs appreciated. Abdomen: Diffuse tenderness on the abdomen specifically in the upper quadrants.  No rigidity.  Bowel sounds present. Musculoskeletal: No edema. Skin: Icterus and appears pale. Neurologic: Alert awake oriented to time place and person.  Moves all extremities. Psychiatric: Appears normal.  Normal affect.   Labs on Admission: I have personally reviewed following labs and imaging studies  CBC:  Recent Labs Lab 11/26/16 1133  WBC 12.0*  HGB 10.5*  HCT 30.6*  MCV 85.7  PLT 914   Basic Metabolic Panel:  Recent Labs Lab 11/26/16 1133  NA 132*  K 3.7  CL 101  CO2 14*  GLUCOSE 120*  BUN 65*  CREATININE 2.46*  CALCIUM 9.7   GFR: Estimated Creatinine Clearance: 11.8 mL/min (A) (by C-G formula based on SCr of 2.46 mg/dL (H)). Liver Function Tests:  Recent Labs Lab 11/26/16 1133  AST 553*  ALT 429*  ALKPHOS 798*  BILITOT 10.3*  PROT 7.3  ALBUMIN 3.2*    Recent  Labs Lab 11/26/16 1133  LIPASE 213*   No results for input(s): AMMONIA in the last 168 hours. Coagulation Profile:  Recent Labs Lab 11/26/16 1056  INR 1.20   Cardiac Enzymes: No results for input(s): CKTOTAL, CKMB, CKMBINDEX, TROPONINI in the last 168 hours. BNP (last 3 results) No results for input(s): PROBNP in the last 8760 hours. HbA1C: No results for input(s): HGBA1C in the last 72 hours. CBG: No results for input(s): GLUCAP in the last 168 hours. Lipid Profile: No results for input(s): CHOL, HDL, LDLCALC, TRIG, CHOLHDL, LDLDIRECT  in the last 72 hours. Thyroid Function Tests: No results for input(s): TSH, T4TOTAL, FREET4, T3FREE, THYROIDAB in the last 72 hours. Anemia Panel: No results for input(s): VITAMINB12, FOLATE, FERRITIN, TIBC, IRON, RETICCTPCT in the last 72 hours. Urine analysis:    Component Value Date/Time   COLORURINE YELLOW 11/26/2016 [place] 11/26/2016 1345   LABSPEC 1.010 11/26/2016 1345   PHURINE 5.0 11/26/2016 1345   GLUCOSEU NEGATIVE 11/26/2016 1345   GLUCOSEU NEG mg/dL 06/26/2008 2042   HGBUR NEGATIVE 11/26/2016 1345   BILIRUBINUR NEGATIVE 11/26/2016 1345   KETONESUR NEGATIVE 11/26/2016 1345   PROTEINUR NEGATIVE 11/26/2016 1345   UROBILINOGEN 0.2 06/26/2008 2042   NITRITE NEGATIVE 11/26/2016 1345   LEUKOCYTESUR NEGATIVE 11/26/2016 1345   Sepsis Labs: @LABRCNTIP (procalcitonin:4,lacticidven:4) )No results found for this or any previous visit (from the past 240 hour(s)).   Radiological Exams on Admission: Ct Abdomen Pelvis Wo Contrast  Result Date: 11/26/2016 CLINICAL DATA:  Abdominal pain for a week. EXAM: CT ABDOMEN AND PELVIS WITHOUT CONTRAST TECHNIQUE: Multidetector CT imaging of the abdomen and pelvis was performed following the standard protocol without IV contrast. COMPARISON:  07/13/2009 pelvis CT. FINDINGS: Lower chest: Calcified granulomas in the right lower lobe. Small to moderate sliding hiatal hernia.  Hepatobiliary: Severe intra and extrahepatic bile duct dilatation. There are superimposed subtle low-density masses throughout the liver.There is bulky masslike appearance at the hepatic hilum and upper gallbladder fossa where the gallbladder neck and the common bile duct are not visible. Cholelithiasis. The gallbladder is markedly dilated without inflammatory wall thickening. Pancreas: The pancreas appears displaced by the mass. No main duct dilatation to suggest primary pancreatic lesion. Spleen: Granulomatous calcifications. Adrenals/Urinary Tract: Negative adrenals. Atrophic kidneys with presumed cystic densities. No hydronephrosis. Unremarkable bladder. Stomach/Bowel:  No obstruction. No inflammatory changes. Vascular/Lymphatic: Soft tissue density at the hepatic hilum is likely both primary mass and adenopathy. No distant adenopathy in the deep liver drainage. Mild atherosclerotic calcification for age. Reproductive:Hysterectomy Other: No ascites or pneumoperitoneum. Musculoskeletal: No acute abnormalities. Remote L3 and L4 compression fractures. Spinal degeneration and mild scoliosis. Osteopenia. IMPRESSION: Large mass at the hepatic hilum/gallbladder fossa, favor cholangiocarcinoma. There is severe bile duct and gallbladder obstruction. Multiple ill-defined low-density masses throughout the liver attributed to metastatic disease. Electronically Signed   By: Monte Fantasia M.D.   On: 11/26/2016 13:04    EKG: Independently reviewed.  Normal sinus rhythm with APCs.  Assessment/Plan Principal Problem:   Jaundice Active Problems:   Anemia in chronic kidney disease   Essential hypertension   Abdominal mass    1. Abdominal pain with gallbladder fossa mass concerning for cholangiocarcinoma with severe biliary tract obstruction with obstructive jaundice and possible pancreatitis - general surgery and gastroenterology has been consulted.  I have discussed with Dr. Oletta Lamas on-call gastroenterologist who  will be seeing patient in consult.  Patient is kept on clear liquid diet if tolerated otherwise will keep patient n.p.o.  Gentle hydration.  Patient likely will need biliary stent as per the gastroenterologist.  Patient also will need biopsy of the mass will await surgical input.  Patient is placed on fentanyl for pain relief. 2. Hypertension -patient states she does not take any medications for blood pressure due to multiple allergies.  If patient would agree I will keep patient on as needed IV hydralazine for now. 3. Chronic kidney disease stage III creatinine appears to be at baseline -closely follow metabolic panel. 4. Anemia probably of chronic kidney disease -follow CBC.  5. Multiple medical allergies.  I have reviewed patient's old charts and labs. Discussed with on-call gastroenterologist.   DVT prophylaxis: SCDs. Code Status: DNR. Family Communication: Patient's daughter. Disposition Plan: To be determined. Consults called: General surgery and gastroenterology. Admission status: Inpatient.   Rise Patience MD [facility] Pager [phone].  If 7PM-7AM, please contact night-coverage [email or web address] Password [email or web address]  11/26/2016, 3:28 PM

## 2016-11-26 NOTE — ED Notes (Signed)
ED TO INPATIENT HANDOFF REPORT  Name/Age/Gender [name] 82 y.o. female  Code Status    Code Status Orders        Start     Ordered   11/26/16 1527  Do not attempt resuscitation (DNR)  Continuous    Question Answer Comment  In the event of cardiac or respiratory ARREST Do not call a "code blue"   In the event of cardiac or respiratory ARREST Do not perform Intubation, CPR, defibrillation or ACLS   In the event of cardiac or respiratory ARREST Use medication by any route, position, wound care, and other measures to relive pain and suffering. May use oxygen, suction and manual treatment of airway obstruction as needed for comfort.      11/26/16 1527    Code Status History    Date Active Date Inactive Code Status Order ID Comments User Context   This patient has a current code status but no historical code status.    Advance Directive Documentation     Most Recent Value  Type of Advance Directive  Healthcare Power of Attorney  Pre-existing out of facility DNR order (yellow form or pink MOST form)  -  "MOST" Form in Place?  -      Home/SNF/Other Home  Chief Complaint Abdominal Pain  Level of Care/Admitting Diagnosis ED Disposition    ED Disposition Condition [place]: [place] [100102]  Level of Care: Med-Surg [16]  Diagnosis: Jaundice [242209]  Admitting Physician: Rise Patience [phone]  Attending Physician: Rise Patience [phone]  Estimated length of stay: past midnight tomorrow  Certification:: I certify this patient will need inpatient services for at least 2 midnights  PT Class (Do Not Modify): Inpatient [101]  PT Acc Code (Do Not Modify): Private [1]       Medical History Past Medical History:  Diagnosis Date  . Chronic kidney disease   . Hypertension     Allergies Allergies  Allergen Reactions  . Airborne [Multiple Vitamins-Minerals]   . Amlodipine Besylate     REACTION: depression,  disorientation, parasthesias, headache, dizziness, rash on leg:  All after one dose.  Marland Kitchen Amoxicillin   . Bactrim [Sulfamethoxazole-Trimethoprim]   . Benicar [Olmesartan]   . Benzonatate   . Coreg [Carvedilol]   . Diltiazem Hcl     REACTION: Sleepy, headache  . Endal Cd [Phenyleph-Chlorphen-Codeine]   . Fexofenadine   . Furosemide     REACTION: Leg pain, jerlky sensation  . Ibuprofen   . Lisinopril     REACTION: Very intolerant.  Was stopped per Dr. Hassell Done.  . Loratadine   . Losartan Potassium     REACTION: [place]  . Monurol [Fosfomycin Tromethamine]   . Omeprazole   . Orudis [Ketoprofen]   . Penicillins     Has patient had a PCN reaction causing immediate rash, facial/tongue/throat swelling, SOB or lightheadedness with hypotension: Unknown Has patient had a PCN reaction causing severe rash involving mucus membranes or skin necrosis: Unknown Has patient had a PCN reaction that required hospitalization: Unknown Has patient had a PCN reaction occurring within the last 10 years: Unknown If all of the above answers are "NO", then may proceed with Cephalosporin use.   . Ranitidine Hcl     IV Location/Drains/Wounds Patient Lines/Drains/Airways Status   Active Line/Drains/Airways    Name:   Placement date:   Placement time:   Site:   Days:   Peripheral IV 11/26/16 Right Antecubital  11/26/16    1100    Antecubital    less than 1          Labs/Imaging Results for orders placed or performed during the hospital encounter of 11/26/16 (from the past 48 hour(s))  I-Stat CG4 Lactic Acid, ED     Status: None   Collection Time: 11/26/16 10:24 AM  Result Value Ref Range   Lactic Acid, Venous 1.25 0.5 - 1.9 mmol/L  Protime-INR     Status: None   Collection Time: 11/26/16 10:56 AM  Result Value Ref Range   Prothrombin Time 15.1 11.4 - 15.2 seconds   INR 1.20   APTT     Status: None   Collection Time: 11/26/16 10:56 AM  Result Value Ref Range   aPTT 34 24 - 36 seconds  Lactate  dehydrogenase     Status: Abnormal   Collection Time: 11/26/16 10:56 AM  Result Value Ref Range   LDH 455 (H) 98 - 192 U/L  Acetaminophen level     Status: Abnormal   Collection Time: 11/26/16 10:56 AM  Result Value Ref Range   Acetaminophen (Tylenol), Serum <10 (L) 10 - 30 ug/mL    Comment:        THERAPEUTIC CONCENTRATIONS VARY SIGNIFICANTLY. A RANGE OF 10-30 ug/mL MAY BE AN EFFECTIVE CONCENTRATION FOR MANY PATIENTS. HOWEVER, SOME ARE BEST TREATED AT CONCENTRATIONS OUTSIDE THIS RANGE. ACETAMINOPHEN CONCENTRATIONS >150 ug/mL AT 4 HOURS AFTER INGESTION AND >50 ug/mL AT 12 HOURS AFTER INGESTION ARE OFTEN ASSOCIATED WITH TOXIC REACTIONS.   Lipase, blood     Status: Abnormal   Collection Time: 11/26/16 11:33 AM  Result Value Ref Range   Lipase 213 (H) 11 - 51 U/L  Comprehensive metabolic panel     Status: Abnormal   Collection Time: 11/26/16 11:33 AM  Result Value Ref Range   Sodium 132 (L) 135 - 145 mmol/L   Potassium 3.7 3.5 - 5.1 mmol/L   Chloride 101 101 - 111 mmol/L   CO2 14 (L) 22 - 32 mmol/L   Glucose, Bld 120 (H) 65 - 99 mg/dL   BUN 65 (H) 6 - 20 mg/dL   Creatinine, Ser 2.46 (H) 0.44 - 1.00 mg/dL   Calcium 9.7 8.9 - 10.3 mg/dL   Total Protein 7.3 6.5 - 8.1 g/dL   Albumin 3.2 (L) 3.5 - 5.0 g/dL   AST 553 (H) 15 - 41 U/L   ALT 429 (H) 14 - 54 U/L   Alkaline Phosphatase 798 (H) 38 - 126 U/L   Total Bilirubin 10.3 (H) 0.3 - 1.2 mg/dL   GFR calc non Af Amer 17 (L) >60 mL/min   GFR calc Af Amer 20 (L) >60 mL/min    Comment: (NOTE) The eGFR has been calculated using the CKD EPI equation. This calculation has not been validated in all clinical situations. eGFR's persistently <60 mL/min signify possible Chronic Kidney Disease.    Anion gap 17 (H) 5 - 15  CBC     Status: Abnormal   Collection Time: 11/26/16 11:33 AM  Result Value Ref Range   WBC 12.0 (H) 4.0 - 10.5 K/uL   RBC 3.57 (L) 3.87 - 5.11 MIL/uL   Hemoglobin 10.5 (L) 12.0 - 15.0 g/dL   HCT 30.6 (L) 36.0 -  46.0 %   MCV 85.7 78.0 - 100.0 fL   MCH 29.4 26.0 - 34.0 pg   MCHC 34.3 30.0 - 36.0 g/dL   RDW 15.0 11.5 - 15.5 %   Platelets 376  150 - 400 K/uL  Urinalysis, Routine w reflex microscopic     Status: None   Collection Time: 11/26/16  1:45 PM  Result Value Ref Range   Color, Urine YELLOW YELLOW   APPearance CLEAR CLEAR   Specific Gravity, Urine 1.010 1.005 - 1.030   pH 5.0 5.0 - 8.0   Glucose, UA NEGATIVE NEGATIVE mg/dL   Hgb urine dipstick NEGATIVE NEGATIVE   Bilirubin Urine NEGATIVE NEGATIVE   Ketones, ur NEGATIVE NEGATIVE mg/dL   Protein, ur NEGATIVE NEGATIVE mg/dL   Nitrite NEGATIVE NEGATIVE   Leukocytes, UA NEGATIVE NEGATIVE   Ct Abdomen Pelvis Wo Contrast  Result Date: 11/26/2016 CLINICAL DATA:  Abdominal pain for a week. EXAM: CT ABDOMEN AND PELVIS WITHOUT CONTRAST TECHNIQUE: Multidetector CT imaging of the abdomen and pelvis was performed following the standard protocol without IV contrast. COMPARISON:  07/13/2009 pelvis CT. FINDINGS: Lower chest: Calcified granulomas in the right lower lobe. Small to moderate sliding hiatal hernia. Hepatobiliary: Severe intra and extrahepatic bile duct dilatation. There are superimposed subtle low-density masses throughout the liver.There is bulky masslike appearance at the hepatic hilum and upper gallbladder fossa where the gallbladder neck and the common bile duct are not visible. Cholelithiasis. The gallbladder is markedly dilated without inflammatory wall thickening. Pancreas: The pancreas appears displaced by the mass. No main duct dilatation to suggest primary pancreatic lesion. Spleen: Granulomatous calcifications. Adrenals/Urinary Tract: Negative adrenals. Atrophic kidneys with presumed cystic densities. No hydronephrosis. Unremarkable bladder. Stomach/Bowel:  No obstruction. No inflammatory changes. Vascular/Lymphatic: Soft tissue density at the hepatic hilum is likely both primary mass and adenopathy. No distant adenopathy in the deep liver  drainage. Mild atherosclerotic calcification for age. Reproductive:Hysterectomy Other: No ascites or pneumoperitoneum. Musculoskeletal: No acute abnormalities. Remote L3 and L4 compression fractures. Spinal degeneration and mild scoliosis. Osteopenia. IMPRESSION: Large mass at the hepatic hilum/gallbladder fossa, favor cholangiocarcinoma. There is severe bile duct and gallbladder obstruction. Multiple ill-defined low-density masses throughout the liver attributed to metastatic disease. Electronically Signed   By: Monte Fantasia M.D.   On: 11/26/2016 13:04    Pending Labs Unresulted Labs    Start     Ordered   11/27/16 1610  Basic metabolic panel  Tomorrow morning,   R     11/26/16 1527   11/27/16 0500  CBC  Tomorrow morning,   R     11/26/16 1527   11/27/16 0500  Hepatic function panel  Tomorrow morning,   R     11/26/16 1527      Vitals/Pain Today's Vitals   11/26/16 1200 11/26/16 1230 11/26/16 1530 11/26/16 1532  BP: (!) 162/72 (!) 176/64 (!) 149/72   Pulse: 82 89 90   Resp: (!) _0 Temp:      TempSrc:      SpO2: 95% 98% 97%   Weight:      Height:      PainSc:    7     Isolation Precautions No active isolations  Medications Medications  fentaNYL (SUBLIMAZE) injection 25 mcg (not administered)  ondansetron (ZOFRAN) tablet 4 mg (not administered)    Or  ondansetron (ZOFRAN) injection 4 mg (not administered)  dextrose 5 %-0.9 % sodium chloride infusion (not administered)  ondansetron (ZOFRAN) injection 4 mg (4 mg Intravenous Given 11/26/16 1344)  sodium chloride 0.9 % bolus 1,000 mL (0 mLs Intravenous Stopped 11/26/16 1440)    Mobility walks with person assist

## 2016-11-26 NOTE — ED Triage Notes (Signed)
Abdominal pain has been going on for about a week that has been generalized. Pts primary PCP may have mass located however pt does not know only daughter knows. Pt denies SOB and Chest pain.

## 2016-11-27 LAB — HEPATIC FUNCTION PANEL
ALBUMIN: 2.8 g/dL — AB (ref 3.5–5.0)
ALK PHOS: 780 U/L — AB (ref 38–126)
ALT: 328 U/L — AB (ref 14–54)
AST: 472 U/L — AB (ref 15–41)
BILIRUBIN TOTAL: 10.3 mg/dL — AB (ref 0.3–1.2)
Bilirubin, Direct: 7 mg/dL — ABNORMAL HIGH (ref 0.1–0.5)
Indirect Bilirubin: 3.3 mg/dL — ABNORMAL HIGH (ref 0.3–0.9)
Total Protein: 6.4 g/dL — ABNORMAL LOW (ref 6.5–8.1)

## 2016-11-27 LAB — PROTIME-INR
INR: 1.19
PROTHROMBIN TIME: 15 s (ref 11.4–15.2)

## 2016-11-27 LAB — BASIC METABOLIC PANEL
Anion gap: 12 (ref 5–15)
BUN: 50 mg/dL — AB (ref 6–20)
CALCIUM: 9.1 mg/dL (ref 8.9–10.3)
CO2: 14 mmol/L — AB (ref 22–32)
CREATININE: 1.88 mg/dL — AB (ref 0.44–1.00)
Chloride: 110 mmol/L (ref 101–111)
GFR calc Af Amer: 28 mL/min — ABNORMAL LOW (ref >60)
GFR calc non Af Amer: 24 mL/min — ABNORMAL LOW (ref >60)
GLUCOSE: 123 mg/dL — AB (ref 65–99)
Potassium: 3.8 mmol/L (ref 3.5–5.1)
Sodium: 136 mmol/L (ref 135–145)

## 2016-11-27 LAB — CBC
HCT: 28.7 % — ABNORMAL LOW (ref 36.0–46.0)
Hemoglobin: 9.6 g/dL — ABNORMAL LOW (ref 12.0–15.0)
MCH: 29 pg (ref 26.0–34.0)
MCHC: 33.4 g/dL (ref 30.0–36.0)
MCV: 86.7 fL (ref 78.0–100.0)
Platelets: 365 10*3/uL (ref 150–400)
RBC: 3.31 MIL/uL — ABNORMAL LOW (ref 3.87–5.11)
RDW: 15.2 % (ref 11.5–15.5)
WBC: 11.3 10*3/uL — ABNORMAL HIGH (ref 4.0–10.5)

## 2016-11-27 MED ORDER — OXYCODONE HCL 5 MG/5ML PO SOLN
5.0000 mg | ORAL | Status: AC | PRN
  Administered 2016-11-27 – 2016-11-28 (×3): 5 mg via ORAL
  Filled 2016-11-27 (×3): qty 5

## 2016-11-27 MED ORDER — OXYCODONE HCL 5 MG/5ML PO SOLN
5.0000 mg | ORAL | Status: AC | PRN
  Administered 2016-11-27 (×2): 5 mg via ORAL
  Filled 2016-11-27 (×3): qty 5

## 2016-11-27 MED ORDER — HEPARIN SODIUM (PORCINE) 5000 UNIT/ML IJ SOLN
5000.0000 [IU] | Freq: Three times a day (TID) | INTRAMUSCULAR | Status: DC
  Administered 2016-11-27 – 2016-12-02 (×14): 5000 [IU] via SUBCUTANEOUS
  Filled 2016-11-27 (×14): qty 1

## 2016-11-27 MED ORDER — DEXTROSE-NACL 5-0.9 % IV SOLN
INTRAVENOUS | Status: AC
  Administered 2016-11-27 – 2016-11-28 (×2): via INTRAVENOUS

## 2016-11-27 MED ORDER — VITAMINS A & D EX OINT
TOPICAL_OINTMENT | CUTANEOUS | Status: AC
  Administered 2016-11-27: 5
  Filled 2016-11-27: qty 5

## 2016-11-27 NOTE — Progress Notes (Signed)
@IPLOG @        PROGRESS NOTE                                                                                                                                                                                                             Patient Demographics:    [name], is a 82 y.o. female, DOB - 11-Apr-1934, GLO:[id number]  Admit date - 11/26/2016   Admitting Physician Rise Patience, MD  Outpatient Primary MD for the patient is Patient, No Pcp Per  LOS - 1  Chief Complaint  Patient presents with  . Abdominal Pain       Brief Narrative  [name] is a 82 y.o. female with history of hypertension and chronic kidney disease and anemia who has not been to a doctor for many years presents to the ER with complaints of worsening abdominal pain.  Patient states she has been doing abdominal pain for the last 2-3 months which has acutely worsened last 1 week.  Patient also has been having nausea vomiting.  Pain is mostly in the mid abdomen radiating to the back and chest.  Denies any blood in the vomitus.  Has had bowel movement last one today.  Patient also noticed jaundice.  Denies fever chills.   Subjective:    [place] today has, No headache, No chest pain, No abdominal pain - No Nausea, No new weakness tingling or numbness, No Cough - SOB.  Her abdominal pain is better.   Assessment  & Plan :     1.  Abdominal pain with concern for cholangiocarcinoma.  Currently stable, pain and fever free, on bowel rest, IV fluids, MRCP and possibly ERCP both pending, GI general surgery following.  Currently not on antibiotics will have low threshold of initiating if she develops a fever or pain gets worse.  CEA levels and CA 19.9 level is pending.  2.  Hypertension.  As needed hydralazine.  3.  CKD stage III.  Stable.  Continue to hydrate and monitor.  4.  AOCD.  No acute issues.    Diet : Diet clear liquid Room service appropriate? Yes; Fluid consistency: Thin   Family  Communication  :  None  Code Status :  Full  Disposition Plan  : Stay in the hospital  Consults  : GI, general surgery  Procedures  :    CT abdomen pelvis.  Possible cholangiocarcinoma.  MRCP.  Ordered  DVT Prophylaxis  : Heparin  Lab Results  Component Value Date   PLT  365 11/27/2016    Inpatient Medications  Scheduled Meds: Continuous Infusions: . dextrose 5 % and 0.9% NaCl     PRN Meds:.fentaNYL (SUBLIMAZE) injection, ondansetron **OR** ondansetron (ZOFRAN) IV  Antibiotics  :    Anti-infectives    None         Objective:   Vitals:   11/26/16 1230 11/26/16 1530 11/26/16 1659 11/27/16 0513  BP: (!) 176/64 (!) 149/72 (!) 176/73 138/71  Pulse: 89 90 86 80  Resp: 19 19 20 18   Temp:   98.2 F (36.8 C) 97.9 F (36.6 C)  TempSrc:   Oral Oral  SpO2: 98% 97% 100% 98%  Weight:   50 kg (110 lb 3.7 oz)   Height:   4\' 10"  (1.473 m)     Wt Readings from Last 3 Encounters:  11/26/16 50 kg (110 lb 3.7 oz)  01/12/09 58.5 kg (129 lb)  12/12/08 58.5 kg (129 lb)     Intake/Output Summary (Last 24 hours) at 11/27/16 1120 Last data filed at 11/27/16 0514  Gross per 24 hour  Intake             2090 ml  Output                0 ml  Net             2090 ml     Physical Exam  Awake Alert, Oriented X 3, No new F.N deficits, Normal affect [place].AT,PERRAL, appears jaundiced Supple Neck,No JVD, No cervical lymphadenopathy appriciated.  Symmetrical Chest wall movement, Good air movement bilaterally, CTAB RRR,No Gallops,Rubs or new Murmurs, No Parasternal Heave +ve B.Sounds, Abd Soft, mild RUQ tenderness, No organomegaly appriciated, No rebound - guarding or rigidity. No Cyanosis, Clubbing or edema, No new Rash or bruise       Data Review:    CBC  Recent Labs Lab 11/26/16 1133 11/27/16 0412  WBC 12.0* 11.3*  HGB 10.5* 9.6*  HCT 30.6* 28.7*  PLT 376 365  MCV 85.7 86.7  MCH 29.4 29.0  MCHC 34.3 33.4  RDW 15.0 15.2    Chemistries   Recent Labs Lab  11/26/16 1133 11/27/16 0412  NA 132* 136  K 3.7 3.8  CL 101 110  CO2 14* 14*  GLUCOSE 120* 123*  BUN 65* 50*  CREATININE 2.46* 1.88*  CALCIUM 9.7 9.1  AST 553* 472*  ALT 429* 328*  ALKPHOS 798* 780*  BILITOT 10.3* 10.3*   ------------------------------------------------------------------------------------------------------------------ No results for input(s): CHOL, HDL, LDLCALC, TRIG, CHOLHDL, LDLDIRECT in the last 72 hours.  No results found for: HGBA1C ------------------------------------------------------------------------------------------------------------------ No results for input(s): TSH, T4TOTAL, T3FREE, THYROIDAB in the last 72 hours.  Invalid input(s): FREET3 ------------------------------------------------------------------------------------------------------------------ No results for input(s): VITAMINB12, FOLATE, FERRITIN, TIBC, IRON, RETICCTPCT in the last 72 hours.  Coagulation profile  Recent Labs Lab 11/26/16 1056 11/27/16 0932  INR 1.20 1.19    No results for input(s): DDIMER in the last 72 hours.  Cardiac Enzymes No results for input(s): CKMB, TROPONINI, MYOGLOBIN in the last 168 hours.  Invalid input(s): CK ------------------------------------------------------------------------------------------------------------------ No results found for: BNP  Micro Results No results found for this or any previous visit (from the past 240 hour(s)).  Radiology Reports Ct Abdomen Pelvis Wo Contrast  Result Date: 11/26/2016 CLINICAL DATA:  Abdominal pain for a week. EXAM: CT ABDOMEN AND PELVIS WITHOUT CONTRAST TECHNIQUE: Multidetector CT imaging of the abdomen and pelvis was performed following the standard protocol without IV contrast. COMPARISON:  07/13/2009 pelvis CT. FINDINGS: Lower  chest: Calcified granulomas in the right lower lobe. Small to moderate sliding hiatal hernia. Hepatobiliary: Severe intra and extrahepatic bile duct dilatation. There are  superimposed subtle low-density masses throughout the liver.There is bulky masslike appearance at the hepatic hilum and upper gallbladder fossa where the gallbladder neck and the common bile duct are not visible. Cholelithiasis. The gallbladder is markedly dilated without inflammatory wall thickening. Pancreas: The pancreas appears displaced by the mass. No main duct dilatation to suggest primary pancreatic lesion. Spleen: Granulomatous calcifications. Adrenals/Urinary Tract: Negative adrenals. Atrophic kidneys with presumed cystic densities. No hydronephrosis. Unremarkable bladder. Stomach/Bowel:  No obstruction. No inflammatory changes. Vascular/Lymphatic: Soft tissue density at the hepatic hilum is likely both primary mass and adenopathy. No distant adenopathy in the deep liver drainage. Mild atherosclerotic calcification for age. Reproductive:Hysterectomy Other: No ascites or pneumoperitoneum. Musculoskeletal: No acute abnormalities. Remote L3 and L4 compression fractures. Spinal degeneration and mild scoliosis. Osteopenia. IMPRESSION: Large mass at the hepatic hilum/gallbladder fossa, favor cholangiocarcinoma. There is severe bile duct and gallbladder obstruction. Multiple ill-defined low-density masses throughout the liver attributed to metastatic disease. Electronically Signed   By: Monte Fantasia M.D.   On: 11/26/2016 13:04    Time Spent in minutes  30   Lala Lund M.D on 11/27/2016 at 11:20 AM  Between 7am to 7pm - Pager - [phone] ( page via [email or web address], text pages only, please mention full 10 digit call back number). After 7pm go to [email or web address] - password [email or web address]

## 2016-11-27 NOTE — Progress Notes (Signed)
[facility] PROGRESS NOTE Subjective patient states that she feels much better with much less abdominal pain. MRCP will probably be done tomorrow according to the patient  Objective: Vital signs in last 24 hours: Temp:  [97.9 F (36.6 C)-98.2 F (36.8 C)] 97.9 F (36.6 C) (10/28 0513) Pulse Rate:  [76-90] 80 (10/28 0513) Resp:  [16-24] 18 (10/28 0513) BP: (138-176)/(64-78) 138/71 (10/28 0513) SpO2:  [95 %-100 %] 98 % (10/28 0513) Weight:  [50 kg (110 lb 3.7 oz)] 50 kg (110 lb 3.7 oz) (10/27 1659) Last BM Date: 11/26/16  Intake/Output from previous day: 10/27 0701 - 10/28 0700 In: 2090 [P.O.:480; I.V.:610; IV Piggyback:1000] Out: -  Intake/Output this shift: No intake/output data recorded.  PE: General-- no obvious distress  Abdomen-- still somewhat tender but less so. Few bowel sounds present  Lab Results:  Recent Labs  11/26/16 1133 11/27/16 0412  WBC 12.0* 11.3*  HGB 10.5* 9.6*  HCT 30.6* 28.7*  PLT 376 365   BMET  Recent Labs  11/26/16 1133 11/27/16 0412  NA 132* 136  K 3.7 3.8  CL 101 110  CO2 14* 14*  CREATININE 2.46* 1.88*   LFT  Recent Labs  11/26/16 1133 11/27/16 0412  PROT 7.3 6.4*  AST 553* 472*  ALT 429* 328*  ALKPHOS 798* 780*  BILITOT 10.3* 10.3*  BILIDIR  --  7.0*  IBILI  --  3.3*   PT/INR  Recent Labs  11/26/16 1056 11/27/16 0932  LABPROT 15.1 15.0  INR 1.20 1.19   PANCREAS  Recent Labs  11/26/16 1133  LIPASE 213*         Studies/Results: Ct Abdomen Pelvis Wo Contrast  Result Date: 11/26/2016 CLINICAL DATA:  Abdominal pain for a week. EXAM: CT ABDOMEN AND PELVIS WITHOUT CONTRAST TECHNIQUE: Multidetector CT imaging of the abdomen and pelvis was performed following the standard protocol without IV contrast. COMPARISON:  07/13/2009 pelvis CT. FINDINGS: Lower chest: Calcified granulomas in the right lower lobe. Small to moderate sliding hiatal hernia. Hepatobiliary: Severe intra and extrahepatic bile  duct dilatation. There are superimposed subtle low-density masses throughout the liver.There is bulky masslike appearance at the hepatic hilum and upper gallbladder fossa where the gallbladder neck and the common bile duct are not visible. Cholelithiasis. The gallbladder is markedly dilated without inflammatory wall thickening. Pancreas: The pancreas appears displaced by the mass. No main duct dilatation to suggest primary pancreatic lesion. Spleen: Granulomatous calcifications. Adrenals/Urinary Tract: Negative adrenals. Atrophic kidneys with presumed cystic densities. No hydronephrosis. Unremarkable bladder. Stomach/Bowel:  No obstruction. No inflammatory changes. Vascular/Lymphatic: Soft tissue density at the hepatic hilum is likely both primary mass and adenopathy. No distant adenopathy in the deep liver drainage. Mild atherosclerotic calcification for age. Reproductive:Hysterectomy Other: No ascites or pneumoperitoneum. Musculoskeletal: No acute abnormalities. Remote L3 and L4 compression fractures. Spinal degeneration and mild scoliosis. Osteopenia. IMPRESSION: Large mass at the hepatic hilum/gallbladder fossa, favor cholangiocarcinoma. There is severe bile duct and gallbladder obstruction. Multiple ill-defined low-density masses throughout the liver attributed to metastatic disease. Electronically Signed   By: Monte Fantasia M.D.   On: 11/26/2016 13:04    Medications: I have reviewed the patient's current medications.  Assessment:   1. Biliary obstruction due to mass at the porta hepatis/gallbladder fossa. This is almost certainly a malignant process either coming from the liver or biliary system. Her labs have not changed at all since admission. She will likely need either ERCP with placement of biliary stent for drainage or PTC. Hopefully one of these  processes would be able to obtain cells for a tissue diagnosis. The patient and her 2 daughters are aware that this process is likely to have a bad  outcome. 2. Gallstones. Probably incidental gallbladder is massively dilated probably due to biliary obstruction.   Plan: 1. Will go ahead and obtain MRCP as planned. Dr. Magod*G.I. hospitalist next week and he will review these results in conjunction with therapeutic radiologist and I think a decision can then be made to go ahead with drainage either via ERCP or PTC.   EDWARDS JR,JAMES L 11/27/2016, 10:48 AM  This note was created using voice recognition software. Minor errors may Have occurred unintentionally.  Pager: [phone] If no answer or after hours call [phone]  

## 2016-11-28 ENCOUNTER — Inpatient Hospital Stay (HOSPITAL_COMMUNITY): Payer: Medicare HMO | Admitting: Anesthesiology

## 2016-11-28 ENCOUNTER — Encounter (HOSPITAL_COMMUNITY)
Admission: EM | Disposition: A | Discharge status: Home / Self Care | Payer: Self-pay | Source: Home / Self Care | Attending: Internal Medicine

## 2016-11-28 ENCOUNTER — Inpatient Hospital Stay (HOSPITAL_COMMUNITY): Payer: Medicare HMO

## 2016-11-28 ENCOUNTER — Encounter (HOSPITAL_COMMUNITY): Payer: Self-pay | Admitting: Gastroenterology

## 2016-11-28 HISTORY — PX: ERCP: SHX5425

## 2016-11-28 LAB — MAGNESIUM: Magnesium: 1.8 mg/dL (ref 1.7–2.4)

## 2016-11-28 LAB — COMPREHENSIVE METABOLIC PANEL
ALK PHOS: 758 U/L — AB (ref 38–126)
ALT: 292 U/L — ABNORMAL HIGH (ref 14–54)
AST: 413 U/L — AB (ref 15–41)
Albumin: 2.6 g/dL — ABNORMAL LOW (ref 3.5–5.0)
Anion gap: 10 (ref 5–15)
BILIRUBIN TOTAL: 11.4 mg/dL — AB (ref 0.3–1.2)
BUN: 40 mg/dL — AB (ref 6–20)
CALCIUM: 9 mg/dL (ref 8.9–10.3)
CO2: 14 mmol/L — ABNORMAL LOW (ref 22–32)
Chloride: 113 mmol/L — ABNORMAL HIGH (ref 101–111)
Creatinine, Ser: 1.79 mg/dL — ABNORMAL HIGH (ref 0.44–1.00)
GFR calc Af Amer: 30 mL/min — ABNORMAL LOW (ref >60)
GFR calc non Af Amer: 26 mL/min — ABNORMAL LOW (ref >60)
Glucose, Bld: 115 mg/dL — ABNORMAL HIGH (ref 65–99)
POTASSIUM: 4.4 mmol/L (ref 3.5–5.1)
Sodium: 137 mmol/L (ref 135–145)
TOTAL PROTEIN: 5.8 g/dL — AB (ref 6.5–8.1)

## 2016-11-28 LAB — CBC
HEMATOCRIT: 26.5 % — AB (ref 36.0–46.0)
HEMOGLOBIN: 8.8 g/dL — AB (ref 12.0–15.0)
MCH: 29.2 pg (ref 26.0–34.0)
MCHC: 33.2 g/dL (ref 30.0–36.0)
MCV: 88 fL (ref 78.0–100.0)
Platelets: 329 10*3/uL (ref 150–400)
RBC: 3.01 MIL/uL — ABNORMAL LOW (ref 3.87–5.11)
RDW: 15.8 % — ABNORMAL HIGH (ref 11.5–15.5)
WBC: 11.5 10*3/uL — AB (ref 4.0–10.5)

## 2016-11-28 LAB — CANCER ANTIGEN 19-9: CA 19-9: 7725 U/mL — ABNORMAL HIGH (ref 0–35)

## 2016-11-28 LAB — CEA: CEA1: 2.4 ng/mL (ref 0.0–4.7)

## 2016-11-28 SURGERY — ERCP, WITH INTERVENTION IF INDICATED
Anesthesia: General

## 2016-11-28 MED ORDER — GLUCAGON HCL RDNA (DIAGNOSTIC) 1 MG IJ SOLR
INTRAMUSCULAR | Status: AC
  Filled 2016-11-28: qty 1

## 2016-11-28 MED ORDER — SODIUM CHLORIDE 0.9 % IV SOLN
INTRAVENOUS | Status: DC
  Administered 2016-11-28 (×2): via INTRAVENOUS

## 2016-11-28 MED ORDER — DEXAMETHASONE SODIUM PHOSPHATE 10 MG/ML IJ SOLN
INTRAMUSCULAR | Status: AC
  Filled 2016-11-28: qty 1

## 2016-11-28 MED ORDER — PROPOFOL 10 MG/ML IV BOLUS
INTRAVENOUS | Status: DC | PRN
  Administered 2016-11-28: 110 mg via INTRAVENOUS

## 2016-11-28 MED ORDER — PHENYLEPHRINE 40 MCG/ML (10ML) SYRINGE FOR IV PUSH (FOR BLOOD PRESSURE SUPPORT)
PREFILLED_SYRINGE | INTRAVENOUS | Status: AC
  Filled 2016-11-28: qty 10

## 2016-11-28 MED ORDER — CHLORHEXIDINE GLUCONATE CLOTH 2 % EX PADS
6.0000 | MEDICATED_PAD | Freq: Every day | CUTANEOUS | Status: DC
  Administered 2016-11-28 – 2016-11-29 (×2): 6 via TOPICAL

## 2016-11-28 MED ORDER — ROCURONIUM BROMIDE 10 MG/ML (PF) SYRINGE
PREFILLED_SYRINGE | INTRAVENOUS | Status: DC | PRN
  Administered 2016-11-28: 30 mg via INTRAVENOUS

## 2016-11-28 MED ORDER — DEXAMETHASONE SODIUM PHOSPHATE 10 MG/ML IJ SOLN
INTRAMUSCULAR | Status: DC | PRN
  Administered 2016-11-28: 10 mg via INTRAVENOUS

## 2016-11-28 MED ORDER — SUGAMMADEX SODIUM 200 MG/2ML IV SOLN
INTRAVENOUS | Status: DC | PRN
  Administered 2016-11-28: 100 mg via INTRAVENOUS

## 2016-11-28 MED ORDER — ONDANSETRON HCL 4 MG/2ML IJ SOLN
INTRAMUSCULAR | Status: DC | PRN
  Administered 2016-11-28: 4 mg via INTRAVENOUS

## 2016-11-28 MED ORDER — ROCURONIUM BROMIDE 50 MG/5ML IV SOSY
PREFILLED_SYRINGE | INTRAVENOUS | Status: AC
  Filled 2016-11-28: qty 5

## 2016-11-28 MED ORDER — FENTANYL CITRATE (PF) 100 MCG/2ML IJ SOLN
INTRAMUSCULAR | Status: DC | PRN
  Administered 2016-11-28 (×2): 50 ug via INTRAVENOUS

## 2016-11-28 MED ORDER — CIPROFLOXACIN IN D5W 400 MG/200ML IV SOLN
INTRAVENOUS | Status: AC
  Filled 2016-11-28: qty 200

## 2016-11-28 MED ORDER — LIDOCAINE 2% (20 MG/ML) 5 ML SYRINGE
INTRAMUSCULAR | Status: DC | PRN
  Administered 2016-11-28: 50 mg via INTRAVENOUS

## 2016-11-28 MED ORDER — MUPIROCIN 2 % EX OINT
1.0000 "application " | TOPICAL_OINTMENT | Freq: Two times a day (BID) | CUTANEOUS | Status: DC
  Administered 2016-11-28 – 2016-11-29 (×3): 1 via NASAL
  Filled 2016-11-28: qty 22

## 2016-11-28 MED ORDER — INDOMETHACIN 50 MG RE SUPP
RECTAL | Status: AC
  Filled 2016-11-28: qty 2

## 2016-11-28 MED ORDER — SODIUM CHLORIDE 0.9 % IV SOLN
INTRAVENOUS | Status: DC | PRN
  Administered 2016-11-28: 40 mL

## 2016-11-28 MED ORDER — PROPOFOL 10 MG/ML IV BOLUS
INTRAVENOUS | Status: AC
  Filled 2016-11-28: qty 20

## 2016-11-28 MED ORDER — LIDOCAINE 2% (20 MG/ML) 5 ML SYRINGE
INTRAMUSCULAR | Status: AC
  Filled 2016-11-28: qty 5

## 2016-11-28 MED ORDER — INDOMETHACIN 50 MG RE SUPP
RECTAL | Status: DC | PRN
  Administered 2016-11-28: 100 mg via RECTAL

## 2016-11-28 MED ORDER — ONDANSETRON HCL 4 MG/2ML IJ SOLN
INTRAMUSCULAR | Status: AC
  Filled 2016-11-28: qty 2

## 2016-11-28 MED ORDER — PHENYLEPHRINE 40 MCG/ML (10ML) SYRINGE FOR IV PUSH (FOR BLOOD PRESSURE SUPPORT)
PREFILLED_SYRINGE | INTRAVENOUS | Status: DC | PRN
  Administered 2016-11-28 (×2): 80 ug via INTRAVENOUS
  Administered 2016-11-28 (×2): 40 ug via INTRAVENOUS

## 2016-11-28 MED ORDER — HYDRALAZINE HCL 20 MG/ML IJ SOLN
10.0000 mg | Freq: Four times a day (QID) | INTRAMUSCULAR | Status: DC | PRN
  Administered 2016-11-28: 10 mg via INTRAVENOUS
  Filled 2016-11-28: qty 1

## 2016-11-28 MED ORDER — FENTANYL CITRATE (PF) 100 MCG/2ML IJ SOLN
INTRAMUSCULAR | Status: AC
  Filled 2016-11-28: qty 2

## 2016-11-28 MED ORDER — CIPROFLOXACIN IN D5W 400 MG/200ML IV SOLN
400.0000 mg | Freq: Once | INTRAVENOUS | Status: AC
Start: 2016-11-28 — End: 2016-11-28
  Administered 2016-11-28: 400 mg via INTRAVENOUS

## 2016-11-28 NOTE — Progress Notes (Signed)
After my procedure I discussed her case extensively with her daughter and they requested a consult with Dr. Marin Olp who I discussed the case with and he has taking care of her husband and he will see the patient tomorrow and probably proceed with IR liver biopsy of one of her metastases for diagnosis after that consultation

## 2016-11-28 NOTE — Anesthesia Procedure Notes (Signed)
Procedure Name: Intubation Date/Time: 11/28/2016 11:50 AM Performed by: Noralyn Pick D Pre-anesthesia Checklist: Patient identified, Emergency Drugs available, Suction available and Patient being monitored Patient Re-evaluated:Patient Re-evaluated prior to induction Oxygen Delivery Method: Circle system utilized Preoxygenation: Pre-oxygenation with 100% oxygen Induction Type: IV induction Ventilation: Mask ventilation without difficulty Laryngoscope Size: Mac and 3 Grade View: Grade II Tube type: Oral Tube size: 7.5 mm Number of attempts: 1 Airway Equipment and Method: Stylet Placement Confirmation: ETT inserted through vocal cords under direct vision,  positive ETCO2 and breath sounds checked- equal and bilateral Secured at: 21 cm Tube secured with: Tape Dental Injury: Teeth and Oropharynx as per pre-operative assessment

## 2016-11-28 NOTE — Transfer of Care (Signed)
Immediate Anesthesia Transfer of Care Note  Patient: [name]  Procedure(s) Performed: ENDOSCOPIC RETROGRADE CHOLANGIOPANCREATOGRAPHY (ERCP) (N/A )  Patient Location: PACU  Anesthesia Type:General  Level of Consciousness: awake, alert  and oriented  Airway & Oxygen Therapy: Patient Spontanous Breathing and Patient connected to face mask oxygen  Post-op Assessment: Report given to RN and Post -op Vital signs reviewed and stable  Post vital signs: Reviewed and stable  Last Vitals:  Vitals:   11/28/16 0921 11/28/16 1055  BP:  (!) 150/66  Pulse:  93  Resp:  20  Temp:  36.8 C  SpO2: 96% 96%    Last Pain:  Vitals:   11/28/16 1055  TempSrc: Oral  PainSc:          Complications: No apparent anesthesia complications

## 2016-11-28 NOTE — Anesthesia Preprocedure Evaluation (Addendum)
Anesthesia Evaluation  Patient identified by MRN, date of birth, ID band Patient awake    Reviewed: Allergy & Precautions, NPO status , Patient's Chart, lab work & pertinent test results  Airway Mallampati: II  TM Distance: >3 FB Neck ROM: Full    Dental  (+) Teeth Intact, Dental Advisory Given   Pulmonary neg pulmonary ROS,    breath sounds clear to auscultation       Cardiovascular hypertension,  Rhythm:Regular Rate:Normal     Neuro/Psych negative neurological ROS     GI/Hepatic negative GI ROS, Neg liver ROS,   Endo/Other  negative endocrine ROS  Renal/GU Renal disease     Musculoskeletal negative musculoskeletal ROS (+)   Abdominal   Peds  Hematology negative hematology ROS (+)   Anesthesia Other Findings Day of surgery medications reviewed with the patient.  Reproductive/Obstetrics                            Lab Results  Component Value Date   WBC 11.5 (H) 11/28/2016   HGB 8.8 (L) 11/28/2016   HCT 26.5 (L) 11/28/2016   MCV 88.0 11/28/2016   PLT 329 11/28/2016   Lab Results  Component Value Date   CREATININE 1.79 (H) 11/28/2016   BUN 40 (H) 11/28/2016   NA 137 11/28/2016   K 4.4 11/28/2016   CL 113 (H) 11/28/2016   CO2 14 (L) 11/28/2016   Lab Results  Component Value Date   INR 1.19 11/27/2016   INR 1.20 11/26/2016   INR 1.1 07/21/2006   EKG: normal sinus rhythm, PAC's noted.  Anesthesia Physical Anesthesia Plan  ASA: II  Anesthesia Plan: General   Post-op Pain Management:    Induction: Intravenous  PONV Risk Score and Plan: 4 or greater and Ondansetron, Dexamethasone and Treatment may vary due to age or medical condition  Airway Management Planned: Oral ETT  Additional Equipment:   Intra-op Plan:   Post-operative Plan: Extubation in OR  Informed Consent: I have reviewed the patients History and Physical, chart, labs and discussed the procedure  including the risks, benefits and alternatives for the proposed anesthesia with the patient or authorized representative who has indicated his/her understanding and acceptance.   Dental advisory given  Plan Discussed with: CRNA  Anesthesia Plan Comments:        Anesthesia Quick Evaluation

## 2016-11-28 NOTE — Progress Notes (Signed)
[name] 10:11 AM  Subjective: Patient seen and examined and her case discussed with my partner Dr. Oletta Lamas and her CT and MRI reviewed and case discussed with the hospital team as well and pain is not quite as bad as when it started and she has no new complaints and is thirsty and wants to eat and says she's only been jaundice for a week or so  Objective: Vital signs stable afebrile no acute distress lungs are clear regular rate and rhythm abdomen is a little firm with some discomfort lower is soft nontender occasional bowel sounds labs CT MRI reviewed as above  Assessment: Obstructive jaundice with probable gallbladder versus bile duct metastatic disease and the likely diagnosis including metastatic disease was discussed with the patient as well  Plan: The risks benefits methods and success rate  and options of ERCP PTC or even surgical options was discussed with the patient and she is in agreement with proceeding with an ERCP today with further workup and plans pending those findings  [facility]  Pager [phone] After 5PM or if no answer call [phone]

## 2016-11-28 NOTE — Anesthesia Postprocedure Evaluation (Signed)
Anesthesia Post Note  Patient: [name]  Procedure(s) Performed: ENDOSCOPIC RETROGRADE CHOLANGIOPANCREATOGRAPHY (ERCP) (N/A )     Patient location during evaluation: PACU Anesthesia Type: General Level of consciousness: awake and alert Pain management: pain level controlled Vital Signs Assessment: post-procedure vital signs reviewed and stable Respiratory status: spontaneous breathing, nonlabored ventilation, respiratory function stable and patient connected to nasal cannula oxygen Cardiovascular status: blood pressure returned to baseline and stable Postop Assessment: no apparent nausea or vomiting Anesthetic complications: no    Last Vitals:  Vitals:   11/28/16 1439 11/28/16 1443  BP: (!) 185/84 (!) 182/83  Pulse: 76   Resp: 20   Temp: 36.5 C   SpO2: 96%     Last Pain:  Vitals:   11/28/16 1510  TempSrc:   PainSc: [place]

## 2016-11-28 NOTE — Progress Notes (Signed)
@IPLOG @        PROGRESS NOTE                                                                                                                                                                                                             Patient Demographics:    [name], is a 82 y.o. female, DOB - 05-26-1934, EXH:[id number]  Admit date - 11/26/2016   Admitting Physician Rise Patience, MD  Outpatient Primary MD for the patient is Patient, No Pcp Per  LOS - 2  Chief Complaint  Patient presents with  . Abdominal Pain       Brief Narrative  [name] is a 82 y.o. female with history of hypertension and chronic kidney disease and anemia who has not been to a doctor for many years presents to the ER with complaints of worsening abdominal pain.  Patient states she has been doing abdominal pain for the last 2-3 months which has acutely worsened last 1 week.  Patient also has been having nausea vomiting.  Pain is mostly in the mid abdomen radiating to the back and chest.  Denies any blood in the vomitus.  Has had bowel movement last one today.  Patient also noticed jaundice.  Denies fever chills.   Subjective:   Patient in bed, appears comfortable, denies any headache, no fever, no chest pain or pressure, no shortness of breath , no abdominal pain. No focal weakness.   Assessment  & Plan :     1.  Abdominal pain with concern for primary gallbladder cancer versus cholangiocarcinoma with possible metastases to the liver and possibly pancreas.  Currently stable, pain and fever free, on bowel rest, IV fluids, MRCP noted, GI on board, patient going to ERCP for stenting to relieve gallbladder obstruction, general surgery and GI both on board, have requested palliative care to evaluate as well for goals of care, medical oncology to a point as well.  Patient is frail and hesitant about considering chemotherapy at least for now.  2.  Hypertension.  As needed hydralazine.  3.  CKD stage  III.  Stable.  Continue to hydrate and monitor.  4.  AOCD.  No acute issues.    Diet : Diet NPO time specified   Family Communication  :  None  Code Status :  Full  Disposition Plan  : Stay in the hospital  Consults  : GI, general surgery  Procedures  :    ERCP.    CT abdomen pelvis.  Possible cholangiocarcinoma.  MRCP.   1. Dominant 8.3 cm infiltrative mass centered at the gallbladder neck, which encases and occludes the biliary confluence in the porta hepatis with marked diffuse intrahepatic biliary ductal dilatation. Differential includes cholangiocarcinoma or primary gallbladder carcinoma. 2. Numerous similar-appearing mildly T2 hyperintense masses scattered throughout the liver, compatible with hepatic metastatic disease. 3. Extrinsic narrowing by the mass of the main portal vein, which appears remain patent . 4. Cholelithiasis. Marked gallbladder distention by the dominant mass. 5. Numerous small pancreatic cystic lesions, largest 1.5 cm in the distal pancreatic body, without overt high risk features on this noncontrast MRI study. 6. Moderate hiatal hernia. 7. Advanced symmetric renal atrophy.  DVT Prophylaxis  : Heparin  Lab Results  Component Value Date   PLT 329 11/28/2016    Inpatient Medications  Scheduled Meds: . Chlorhexidine Gluconate Cloth  6 each Topical Daily  . heparin subcutaneous  5,000 Units Subcutaneous Q8H  . mupirocin ointment  1 application Nasal BID   Continuous Infusions: . sodium chloride 20 mL/hr at 11/28/16 1102  . dextrose 5 % and 0.9% NaCl 50 mL/hr at 11/27/16 1533   PRN Meds:.fentaNYL (SUBLIMAZE) injection, ondansetron **OR** ondansetron (ZOFRAN) IV, oxyCODONE  Antibiotics  :    Anti-infectives    None         Objective:   Vitals:   11/27/16 2308 11/28/16 0451 11/28/16 0921 11/28/16 1055  BP: 138/62 (!) 148/59  (!) 150/66  Pulse: 93 89  93  Resp: 16 16  20   Temp: 98.2 F (36.8 C) 97.8 F (36.6 C)  98.2 F (36.8 C)   TempSrc: Oral Oral  Oral  SpO2: 94% 96% 96% 96%  Weight:  49.5 kg (109 lb 2 oz)    Height:        Wt Readings from Last 3 Encounters:  11/28/16 49.5 kg (109 lb 2 oz)  01/12/09 58.5 kg (129 lb)  12/12/08 58.5 kg (129 lb)     Intake/Output Summary (Last 24 hours) at 11/28/16 1124 Last data filed at 11/28/16 0500  Gross per 24 hour  Intake           1152.5 ml  Output                0 ml  Net           1152.5 ml     Physical Exam  Awake Alert, Oriented X 3, No new F.N deficits, Normal affect [place].AT, does appear jaundiced with scleral icterus, Supple Neck, No JVD, No cervical lymphadenopathy appriciated.  Symmetrical Chest wall movement, Good air movement bilaterally, CTAB RRR,No Gallops, Rubs or new Murmurs, No Parasternal Heave +ve B.Sounds, Abd Soft, mild right upper quadrant tenderness, No organomegaly appriciated, No rebound - guarding or rigidity. No Cyanosis, Clubbing or edema, No new Rash or bruise     Data Review:    CBC  Recent Labs Lab 11/26/16 1133 11/27/16 0412 11/28/16 0408  WBC 12.0* 11.3* 11.5*  HGB 10.5* 9.6* 8.8*  HCT 30.6* 28.7* 26.5*  PLT 376 365 329  MCV 85.7 86.7 88.0  MCH 29.4 29.0 29.2  MCHC 34.3 33.4 33.2  RDW 15.0 15.2 15.8*    Chemistries   Recent Labs Lab 11/26/16 1133 11/27/16 0412 11/28/16 0408  NA 132* 136 137  K 3.7 3.8 4.4  CL 101 110 113*  CO2 14* 14* 14*  GLUCOSE 120* 123* 115*  BUN 65* 50* 40*  CREATININE 2.46* 1.88* 1.79*  CALCIUM 9.7 9.1 9.0  MG  --   --  1.8  AST 553* 472* 413*  ALT 429* 328* 292*  ALKPHOS 798* 780* 758*  BILITOT 10.3* 10.3* 11.4*   ------------------------------------------------------------------------------------------------------------------ No results for input(s): CHOL, HDL, LDLCALC, TRIG, CHOLHDL, LDLDIRECT in the last 72 hours.  No results found for: HGBA1C ------------------------------------------------------------------------------------------------------------------ No  results for input(s): TSH, T4TOTAL, T3FREE, THYROIDAB in the last 72 hours.  Invalid input(s): FREET3 ------------------------------------------------------------------------------------------------------------------ No results for input(s): VITAMINB12, FOLATE, FERRITIN, TIBC, IRON, RETICCTPCT in the last 72 hours.  Coagulation profile  Recent Labs Lab 11/26/16 1056 11/27/16 0932  INR 1.20 1.19    No results for input(s): DDIMER in the last 72 hours.  Cardiac Enzymes No results for input(s): CKMB, TROPONINI, MYOGLOBIN in the last 168 hours.  Invalid input(s): CK ------------------------------------------------------------------------------------------------------------------ No results found for: BNP  Micro Results No results found for this or any previous visit (from the past 240 hour(s)).  Radiology Reports Ct Abdomen Pelvis Wo Contrast  Result Date: 11/26/2016 CLINICAL DATA:  Abdominal pain for a week. EXAM: CT ABDOMEN AND PELVIS WITHOUT CONTRAST TECHNIQUE: Multidetector CT imaging of the abdomen and pelvis was performed following the standard protocol without IV contrast. COMPARISON:  07/13/2009 pelvis CT. FINDINGS: Lower chest: Calcified granulomas in the right lower lobe. Small to moderate sliding hiatal hernia. Hepatobiliary: Severe intra and extrahepatic bile duct dilatation. There are superimposed subtle low-density masses throughout the liver.There is bulky masslike appearance at the hepatic hilum and upper gallbladder fossa where the gallbladder neck and the common bile duct are not visible. Cholelithiasis. The gallbladder is markedly dilated without inflammatory wall thickening. Pancreas: The pancreas appears displaced by the mass. No main duct dilatation to suggest primary pancreatic lesion. Spleen: Granulomatous calcifications. Adrenals/Urinary Tract: Negative adrenals. Atrophic kidneys with presumed cystic densities. No hydronephrosis. Unremarkable bladder.  Stomach/Bowel:  No obstruction. No inflammatory changes. Vascular/Lymphatic: Soft tissue density at the hepatic hilum is likely both primary mass and adenopathy. No distant adenopathy in the deep liver drainage. Mild atherosclerotic calcification for age. Reproductive:Hysterectomy Other: No ascites or pneumoperitoneum. Musculoskeletal: No acute abnormalities. Remote L3 and L4 compression fractures. Spinal degeneration and mild scoliosis. Osteopenia. IMPRESSION: Large mass at the hepatic hilum/gallbladder fossa, favor cholangiocarcinoma. There is severe bile duct and gallbladder obstruction. Multiple ill-defined low-density masses throughout the liver attributed to metastatic disease. Electronically Signed   By: Monte Fantasia M.D.   On: 11/26/2016 13:04   Mr Abdomen Mrcp Wo Contrast  Result Date: 11/28/2016 CLINICAL DATA:  82 year old female inpatient with jaundice, biliary obstruction and multiple hepatic masses on noncontrast CT. EXAM: MRI ABDOMEN WITHOUT CONTRAST  (INCLUDING MRCP) TECHNIQUE: Multiplanar multisequence MR imaging of the abdomen was performed. Heavily T2-weighted images of the biliary and pancreatic ducts were obtained, and three-dimensional MRCP images were rendered by post processing. COMPARISON:  11/26/2016 unenhanced CT abdomen/pelvis. FINDINGS: Lower chest: Mild hypoventilatory changes in the dependent lung bases. Hepatobiliary: The liver is enlarged by numerous (greater than 10) mildly T2 hyperintense similar-appearing masses scattered throughout the liver, with mild internal heterogeneity. Representative liver masses include a 4.8 x 3.4 cm segment 8 right liver lobe mass (series 8/ image 12) and a 7.0 x 4.5 cm segment 6 right liver lobe mass (series 8/ image 23). No hepatic steatosis. There is a dominant 8.3 x 5.2 cm infiltrative mass centered at the gallbladder neck (series 8/ image 30), which encases and narrows the gallbladder neck and which encases and occludes the biliary  confluence of the right and left hepatic ducts and common hepatic duct. Marked diffuse intrahepatic biliary ductal dilatation. Gallbladder is  prominently distended with layering gallstones measuring up to 2.7 cm in size. No definite gallbladder wall thickening or significant pericholecystic fluid. The distal half of the common bile duct is normal caliber (4 mm diameter). Pancreas: There are several small pancreatic cystic lesions scattered throughout the pancreas, largest 1.5 x 0.7 x 1.3 cm in the inferior distal pancreatic body (series 8/ image 25), most of which demonstrate lobulated outer contours and a few of which demonstrate thin internal septations, with no apparent solid components. No significant main pancreatic duct dilation. No pancreas divisum. Spleen: Normal size. No mass. Adrenals/Urinary Tract: No discrete adrenal nodules. No hydronephrosis. Several simple appearing T2 hyperintense renal cysts in both kidneys, largest 1.8 cm in the interpolar right kidney. No overtly suspicious renal masses. Symmetric moderate to severe bilateral renal parenchymal atrophy. Stomach/Bowel: Moderate hiatal hernia. Otherwise normal nondistended stomach. Visualized small and large bowel is normal caliber, with no bowel wall thickening. Vascular/Lymphatic: Nonaneurysmal abdominal aorta. Preserved flow voids in the portal veins, noting extrinsic narrowing of the main portal vein by the dominant mass (series 8/ image 25). No pathologically enlarged lymph nodes in the abdomen. Other: No abdominal ascites or focal fluid collection. Musculoskeletal: No aggressive appearing focal osseous lesions. IMPRESSION: 1. Dominant 8.3 cm infiltrative mass centered at the gallbladder neck, which encases and occludes the biliary confluence in the porta hepatis with marked diffuse intrahepatic biliary ductal dilatation. Differential includes cholangiocarcinoma or primary gallbladder carcinoma. 2. Numerous similar-appearing mildly T2  hyperintense masses scattered throughout the liver, compatible with hepatic metastatic disease. 3. Extrinsic narrowing by the mass of the main portal vein, which appears remain patent . 4. Cholelithiasis. Marked gallbladder distention by the dominant mass. 5. Numerous small pancreatic cystic lesions, largest 1.5 cm in the distal pancreatic body, without overt high risk features on this noncontrast MRI study. 6. Moderate hiatal hernia. 7. Advanced symmetric renal atrophy. Electronically Signed   By: Ilona Sorrel M.D.   On: 11/28/2016 08:38   Mr 3d Recon At Scanner  Result Date: 11/28/2016 CLINICAL DATA:  82 year old female inpatient with jaundice, biliary obstruction and multiple hepatic masses on noncontrast CT. EXAM: MRI ABDOMEN WITHOUT CONTRAST  (INCLUDING MRCP) TECHNIQUE: Multiplanar multisequence MR imaging of the abdomen was performed. Heavily T2-weighted images of the biliary and pancreatic ducts were obtained, and three-dimensional MRCP images were rendered by post processing. COMPARISON:  11/26/2016 unenhanced CT abdomen/pelvis. FINDINGS: Lower chest: Mild hypoventilatory changes in the dependent lung bases. Hepatobiliary: The liver is enlarged by numerous (greater than 10) mildly T2 hyperintense similar-appearing masses scattered throughout the liver, with mild internal heterogeneity. Representative liver masses include a 4.8 x 3.4 cm segment 8 right liver lobe mass (series 8/ image 12) and a 7.0 x 4.5 cm segment 6 right liver lobe mass (series 8/ image 23). No hepatic steatosis. There is a dominant 8.3 x 5.2 cm infiltrative mass centered at the gallbladder neck (series 8/ image 30), which encases and narrows the gallbladder neck and which encases and occludes the biliary confluence of the right and left hepatic ducts and common hepatic duct. Marked diffuse intrahepatic biliary ductal dilatation. Gallbladder is prominently distended with layering gallstones measuring up to 2.7 cm in size. No definite  gallbladder wall thickening or significant pericholecystic fluid. The distal half of the common bile duct is normal caliber (4 mm diameter). Pancreas: There are several small pancreatic cystic lesions scattered throughout the pancreas, largest 1.5 x 0.7 x 1.3 cm in the inferior distal pancreatic body (series 8/ image 25), most of which  demonstrate lobulated outer contours and a few of which demonstrate thin internal septations, with no apparent solid components. No significant main pancreatic duct dilation. No pancreas divisum. Spleen: Normal size. No mass. Adrenals/Urinary Tract: No discrete adrenal nodules. No hydronephrosis. Several simple appearing T2 hyperintense renal cysts in both kidneys, largest 1.8 cm in the interpolar right kidney. No overtly suspicious renal masses. Symmetric moderate to severe bilateral renal parenchymal atrophy. Stomach/Bowel: Moderate hiatal hernia. Otherwise normal nondistended stomach. Visualized small and large bowel is normal caliber, with no bowel wall thickening. Vascular/Lymphatic: Nonaneurysmal abdominal aorta. Preserved flow voids in the portal veins, noting extrinsic narrowing of the main portal vein by the dominant mass (series 8/ image 25). No pathologically enlarged lymph nodes in the abdomen. Other: No abdominal ascites or focal fluid collection. Musculoskeletal: No aggressive appearing focal osseous lesions. IMPRESSION: 1. Dominant 8.3 cm infiltrative mass centered at the gallbladder neck, which encases and occludes the biliary confluence in the porta hepatis with marked diffuse intrahepatic biliary ductal dilatation. Differential includes cholangiocarcinoma or primary gallbladder carcinoma. 2. Numerous similar-appearing mildly T2 hyperintense masses scattered throughout the liver, compatible with hepatic metastatic disease. 3. Extrinsic narrowing by the mass of the main portal vein, which appears remain patent . 4. Cholelithiasis. Marked gallbladder distention by the  dominant mass. 5. Numerous small pancreatic cystic lesions, largest 1.5 cm in the distal pancreatic body, without overt high risk features on this noncontrast MRI study. 6. Moderate hiatal hernia. 7. Advanced symmetric renal atrophy. Electronically Signed   By: Ilona Sorrel M.D.   On: 11/28/2016 08:38    Time Spent in minutes  30   Lala Lund M.D on 11/28/2016 at 11:24 AM  Between 7am to 7pm - Pager - [phone] ( page via [email or web address], text pages only, please mention full 10 digit call back number). After 7pm go to [email or web address] - password [email or web address]

## 2016-11-28 NOTE — Progress Notes (Addendum)
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  If not successful, agree with ERCP.  Would suggest medical oncology consult as well-I spoke with Dr. Marin Olp who will see her tomorrow.  No role for surgery at this time given extent of disease.   LOS: 2 days        Chief Complaint/Subjective: Has to urinate and has lower abdominal pain because of this.  Helped her to bedside commode and then she felt much better.  Objective: Vital signs in last 24 hours: Temp:  [97.8 F (36.6 C)-98.6 F (37 C)] 97.8 F (36.6 C) (10/29 0451) Pulse Rate:  [81-93] 89 (10/29 0451) Resp:  [16-18] 16 (10/29 0451) BP: (138-148)/(59-75) 148/59 (10/29 0451) SpO2:  [94 %-98 %] 96 % (10/29 0921) Weight:  [49.5 kg (109 lb 2 oz)-50.9 kg (112 lb 3.4 oz)] 49.5 kg (109 lb 2 oz) (10/29 0451) Last BM Date: 11/27/16  Intake/Output from previous day: 10/28 0701 - 10/29 0700 In: 1152.5 [P.O.:480; I.V.:672.5] Out: -  Intake/Output this shift: No intake/output data recorded.  PE: General- In NAD.  Awake and alert. Jaundiced. Abdomen-soft, suprapubic tenderness and fullness  Lab Results:   Recent Labs  11/27/16 0412 11/28/16 0408  WBC 11.3* 11.5*  HGB 9.6* 8.8*  HCT 28.7* 26.5*  PLT 365 329   BMET  Recent Labs  11/27/16 0412 11/28/16 0408  NA 136 137  K 3.8 4.4  CL 110 113*  CO2 14* 14*  GLUCOSE 123* 115*  BUN 50* 40*  CREATININE 1.88* 1.79*  CALCIUM 9.1 9.0   PT/INR  Recent Labs  11/26/16 1056 11/27/16 0932  LABPROT 15.1 15.0  INR 1.20 1.19   Comprehensive Metabolic Panel:    Component Value Date/Time   NA 137 11/28/2016 0408   NA 136 11/27/2016 0412   K 4.4 11/28/2016 0408   K 3.8 11/27/2016 0412   CL 113 (H)  11/28/2016 0408   CL 110 11/27/2016 0412   CO2 14 (L) 11/28/2016 0408   CO2 14 (L) 11/27/2016 0412   BUN 40 (H) 11/28/2016 0408   BUN 50 (H) 11/27/2016 0412   CREATININE 1.79 (H) 11/28/2016 0408   CREATININE 1.88 (H) 11/27/2016 0412   GLUCOSE 115 (H) 11/28/2016 0408   GLUCOSE 123 (H) 11/27/2016 0412   CALCIUM 9.0 11/28/2016 0408   CALCIUM 9.1 11/27/2016 0412   CALCIUM 9.3 11/29/2006 1822   CALCIUM (LL) 07/14/2006 1600    5.5 CRITICAL RESULT CALLED TO, READ BACK BY AND VERIFIED WITH: P SUMMERELL,RN 2215 07/17/06 WBOND (NOTE) Result repeated and verified.  Amended report. CRITICAL CALCIUM KELLER, T. RN ON [id number] AT 1413 BY EBANS   AST 413 (H) 11/28/2016 0408   AST 472 (H) 11/27/2016 0412   ALT 292 (H) 11/28/2016 0408   ALT 328 (H) 11/27/2016 0412   ALKPHOS 758 (H) 11/28/2016 0408   ALKPHOS 780 (H) 11/27/2016 0412   BILITOT 11.4 (H) 11/28/2016 0408   BILITOT 10.3 (H) 11/27/2016 0412   PROT 5.8 (L) 11/28/2016 0408   PROT 6.4 (L) 11/27/2016 0412   ALBUMIN 2.6 (L) 11/28/2016 0408   ALBUMIN 2.8 (L) 11/27/2016 0412     Studies/Results: Ct Abdomen Pelvis Wo Contrast  Result Date: 11/26/2016 CLINICAL DATA:  Abdominal pain  for a week. EXAM: CT ABDOMEN AND PELVIS WITHOUT CONTRAST TECHNIQUE: Multidetector CT imaging of the abdomen and pelvis was performed following the standard protocol without IV contrast. COMPARISON:  07/13/2009 pelvis CT. FINDINGS: Lower chest: Calcified granulomas in the right lower lobe. Small to moderate sliding hiatal hernia. Hepatobiliary: Severe intra and extrahepatic bile duct dilatation. There are superimposed subtle low-density masses throughout the liver.There is bulky masslike appearance at the hepatic hilum and upper gallbladder fossa where the gallbladder neck and the common bile duct are not visible. Cholelithiasis. The gallbladder is markedly dilated without inflammatory wall thickening. Pancreas: The pancreas appears displaced by the mass. No main duct  dilatation to suggest primary pancreatic lesion. Spleen: Granulomatous calcifications. Adrenals/Urinary Tract: Negative adrenals. Atrophic kidneys with presumed cystic densities. No hydronephrosis. Unremarkable bladder. Stomach/Bowel:  No obstruction. No inflammatory changes. Vascular/Lymphatic: Soft tissue density at the hepatic hilum is likely both primary mass and adenopathy. No distant adenopathy in the deep liver drainage. Mild atherosclerotic calcification for age. Reproductive:Hysterectomy Other: No ascites or pneumoperitoneum. Musculoskeletal: No acute abnormalities. Remote L3 and L4 compression fractures. Spinal degeneration and mild scoliosis. Osteopenia. IMPRESSION: Large mass at the hepatic hilum/gallbladder fossa, favor cholangiocarcinoma. There is severe bile duct and gallbladder obstruction. Multiple ill-defined low-density masses throughout the liver attributed to metastatic disease. Electronically Signed   By: Monte Fantasia M.D.   On: 11/26/2016 13:04   Mr Abdomen Mrcp Wo Contrast  Result Date: 11/28/2016 CLINICAL DATA:  82 year old female inpatient with jaundice, biliary obstruction and multiple hepatic masses on noncontrast CT. EXAM: MRI ABDOMEN WITHOUT CONTRAST  (INCLUDING MRCP) TECHNIQUE: Multiplanar multisequence MR imaging of the abdomen was performed. Heavily T2-weighted images of the biliary and pancreatic ducts were obtained, and three-dimensional MRCP images were rendered by post processing. COMPARISON:  11/26/2016 unenhanced CT abdomen/pelvis. FINDINGS: Lower chest: Mild hypoventilatory changes in the dependent lung bases. Hepatobiliary: The liver is enlarged by numerous (greater than 10) mildly T2 hyperintense similar-appearing masses scattered throughout the liver, with mild internal heterogeneity. Representative liver masses include a 4.8 x 3.4 cm segment 8 right liver lobe mass (series 8/ image 12) and a 7.0 x 4.5 cm segment 6 right liver lobe mass (series 8/ image 23). No  hepatic steatosis. There is a dominant 8.3 x 5.2 cm infiltrative mass centered at the gallbladder neck (series 8/ image 30), which encases and narrows the gallbladder neck and which encases and occludes the biliary confluence of the right and left hepatic ducts and common hepatic duct. Marked diffuse intrahepatic biliary ductal dilatation. Gallbladder is prominently distended with layering gallstones measuring up to 2.7 cm in size. No definite gallbladder wall thickening or significant pericholecystic fluid. The distal half of the common bile duct is normal caliber (4 mm diameter). Pancreas: There are several small pancreatic cystic lesions scattered throughout the pancreas, largest 1.5 x 0.7 x 1.3 cm in the inferior distal pancreatic body (series 8/ image 25), most of which demonstrate lobulated outer contours and a few of which demonstrate thin internal septations, with no apparent solid components. No significant main pancreatic duct dilation. No pancreas divisum. Spleen: Normal size. No mass. Adrenals/Urinary Tract: No discrete adrenal nodules. No hydronephrosis. Several simple appearing T2 hyperintense renal cysts in both kidneys, largest 1.8 cm in the interpolar right kidney. No overtly suspicious renal masses. Symmetric moderate to severe bilateral renal parenchymal atrophy. Stomach/Bowel: Moderate hiatal hernia. Otherwise normal nondistended stomach. Visualized small and large bowel is normal caliber, with no bowel wall thickening. Vascular/Lymphatic: Nonaneurysmal abdominal aorta. Preserved flow voids in  the portal veins, noting extrinsic narrowing of the main portal vein by the dominant mass (series 8/ image 25). No pathologically enlarged lymph nodes in the abdomen. Other: No abdominal ascites or focal fluid collection. Musculoskeletal: No aggressive appearing focal osseous lesions. IMPRESSION: 1. Dominant 8.3 cm infiltrative mass centered at the gallbladder neck, which encases and occludes the biliary  confluence in the porta hepatis with marked diffuse intrahepatic biliary ductal dilatation. Differential includes cholangiocarcinoma or primary gallbladder carcinoma. 2. Numerous similar-appearing mildly T2 hyperintense masses scattered throughout the liver, compatible with hepatic metastatic disease. 3. Extrinsic narrowing by the mass of the main portal vein, which appears remain patent . 4. Cholelithiasis. Marked gallbladder distention by the dominant mass. 5. Numerous small pancreatic cystic lesions, largest 1.5 cm in the distal pancreatic body, without overt high risk features on this noncontrast MRI study. 6. Moderate hiatal hernia. 7. Advanced symmetric renal atrophy. Electronically Signed   By: Ilona Sorrel M.D.   On: 11/28/2016 08:38   Mr 3d Recon At Scanner  Result Date: 11/28/2016 CLINICAL DATA:  82 year old female inpatient with jaundice, biliary obstruction and multiple hepatic masses on noncontrast CT. EXAM: MRI ABDOMEN WITHOUT CONTRAST  (INCLUDING MRCP) TECHNIQUE: Multiplanar multisequence MR imaging of the abdomen was performed. Heavily T2-weighted images of the biliary and pancreatic ducts were obtained, and three-dimensional MRCP images were rendered by post processing. COMPARISON:  11/26/2016 unenhanced CT abdomen/pelvis. FINDINGS: Lower chest: Mild hypoventilatory changes in the dependent lung bases. Hepatobiliary: The liver is enlarged by numerous (greater than 10) mildly T2 hyperintense similar-appearing masses scattered throughout the liver, with mild internal heterogeneity. Representative liver masses include a 4.8 x 3.4 cm segment 8 right liver lobe mass (series 8/ image 12) and a 7.0 x 4.5 cm segment 6 right liver lobe mass (series 8/ image 23). No hepatic steatosis. There is a dominant 8.3 x 5.2 cm infiltrative mass centered at the gallbladder neck (series 8/ image 30), which encases and narrows the gallbladder neck and which encases and occludes the biliary confluence of the right and  left hepatic ducts and common hepatic duct. Marked diffuse intrahepatic biliary ductal dilatation. Gallbladder is prominently distended with layering gallstones measuring up to 2.7 cm in size. No definite gallbladder wall thickening or significant pericholecystic fluid. The distal half of the common bile duct is normal caliber (4 mm diameter). Pancreas: There are several small pancreatic cystic lesions scattered throughout the pancreas, largest 1.5 x 0.7 x 1.3 cm in the inferior distal pancreatic body (series 8/ image 25), most of which demonstrate lobulated outer contours and a few of which demonstrate thin internal septations, with no apparent solid components. No significant main pancreatic duct dilation. No pancreas divisum. Spleen: Normal size. No mass. Adrenals/Urinary Tract: No discrete adrenal nodules. No hydronephrosis. Several simple appearing T2 hyperintense renal cysts in both kidneys, largest 1.8 cm in the interpolar right kidney. No overtly suspicious renal masses. Symmetric moderate to severe bilateral renal parenchymal atrophy. Stomach/Bowel: Moderate hiatal hernia. Otherwise normal nondistended stomach. Visualized small and large bowel is normal caliber, with no bowel wall thickening. Vascular/Lymphatic: Nonaneurysmal abdominal aorta. Preserved flow voids in the portal veins, noting extrinsic narrowing of the main portal vein by the dominant mass (series 8/ image 25). No pathologically enlarged lymph nodes in the abdomen. Other: No abdominal ascites or focal fluid collection. Musculoskeletal: No aggressive appearing focal osseous lesions. IMPRESSION: 1. Dominant 8.3 cm infiltrative mass centered at the gallbladder neck, which encases and occludes the biliary confluence in the porta hepatis with marked  diffuse intrahepatic biliary ductal dilatation. Differential includes cholangiocarcinoma or primary gallbladder carcinoma. 2. Numerous similar-appearing mildly T2 hyperintense masses scattered  throughout the liver, compatible with hepatic metastatic disease. 3. Extrinsic narrowing by the mass of the main portal vein, which appears remain patent . 4. Cholelithiasis. Marked gallbladder distention by the dominant mass. 5. Numerous small pancreatic cystic lesions, largest 1.5 cm in the distal pancreatic body, without overt high risk features on this noncontrast MRI study. 6. Moderate hiatal hernia. 7. Advanced symmetric renal atrophy. Electronically Signed   By: Ilona Sorrel M.D.   On: 11/28/2016 08:38    Anti-infectives: Anti-infectives    None       Nyeema Want J 11/28/2016

## 2016-11-28 NOTE — Op Note (Signed)
[facility] Patient Name: [name] Procedure Date: 11/28/2016 MRN: [id number] Attending MD: Clarene Essex , MD Date of Birth: Nov 20, 1934 CSN: [id number] Age: 82 Admit Type: Inpatient Procedure:                ERCP Indications:              Abnormal abdominal CT, Biliary dilation on Computed                            Tomogram Scan, Abnormal MRCP, Malignant gallbladder                            tumor or Cholangiocarcinoma Providers:                Clarene Essex, MD, Cleda Daub, RN, William Dalton,                            Technician Referring MD:              Medicines:                General Anesthesia Complications:            No immediate complications. Estimated Blood Loss:     Estimated blood loss: none. Procedure:                Pre-Anesthesia Assessment:                           - Prior to the procedure, a History and Physical                            was performed, and patient medications and                            allergies were reviewed. The patient's tolerance of                            previous anesthesia was also reviewed. The risks                            and benefits of the procedure and the sedation                            options and risks were discussed with the patient.                            All questions were answered, and informed consent                            was obtained. Prior Anticoagulants: The patient has                            taken no previous anticoagulant or antiplatelet                            agents. ASA Grade Assessment: II -  A patient with                            mild systemic disease. After reviewing the risks                            and benefits, the patient was deemed in                            satisfactory condition to undergo the procedure.                           After obtaining informed consent, the scope was                            passed under direct vision. Throughout the                             procedure, the patient's blood pressure, pulse, and                            oxygen saturations were monitored continuously. The                            [id number] ([id number]) scope was introduced through                            the mouth, and used to inject contrast into and                            used to locate the major papilla. The ERCP was                            technically difficult and complex due to abnormal                            anatomy. The patient tolerated the procedure well. Scope In: Scope Out: Findings:      The major papilla was normal. The scope was passed under direct vision       through the upper GI tract. Few non-bleeding superficial duodenal ulcers       with no stigmata of bleeding were found in the second portion of the       duodenum. on initial wire advancement we cannulated the pancreatic duct       and we placed One 5 Fr by 5 cm plastic stent with a single external       pigtail and a single internal flap was placed 3.5 cm into the ventral       pancreatic duct. The stent was in good position. we then repositioned       the sphincterotome and were able to get deep selective cannulation using       the wire and then we proceeded with Biliary sphincterotomy was made with       a Hydratome sphincterotome using ERBE electrocautery. There was no       post-sphincterotomy bleeding. we  did a small to medium size       sphincterotomy just to assist with access and could get the half bodes       sphincterotome easily in and out of the duct The upper third of the main       bile duct and hepatic duct bifurcation contained multiple diffuse       stenoses.there were multiple dilated segments and we passed the wire       into 1 segment and then using the sphincterotome past a second wire into       a different segment and we then proceeded with multiple occlusion       cholangiograms to decide where to place the stent and length  of the       stent and her anatomy was not easy to delineateand we elected to place       One 10 Fr by 8 cm uncovered metal stent was placed 8 cm into the common       bile duct. in order to place it as far proximally as we could the distal       end of the stent was just at the ampulla and Clear fluid IE dye flowed       through the stent throughout the remainder of the procedure and The       stent was in good position. at this point we thought about placing a       stent in a different right segment where are second wire was but the       other left side dilated area seen more significant and we tried       manipulating the wire using the balloon catheter to try to get into this       system but after a prolonged effort we elected to stop at this time and       even though we could replace the wire in the further right system with       elected not to place another stent and see how she does Impression:               - The major papilla appeared normal.                           - Multiple non-bleeding duodenal ulcers with no                            stigmata of bleeding.                           - A diffuse biliary stricture was found. The                            stricture was malignant appearing. This stricture                            was treated with biliary sphincterotomy.                           - One plastic stent was placed into the ventral  pancreatic duct.                           - A sphincterotomy was performed.                           - One uncovered metal stent was placed into the                            common bile duct. Moderate Sedation:      N/A- Per Anesthesia Care Recommendation:           - Clear liquid diet today. may slowly advance                            tomorrow if doing well                           - Continue present medications. rediscussed her                            allergies to pump inhibitors and H2  blockers and                            either retry a different one of those once a day or                            even consider Carafate for the ulcers we've seen                           - Return to GI clinic PRN. she might need a CT                            directed liver biopsy of one of her metastases if                            definitive diagnosis is needed                           - Telephone GI clinic if symptomatic PRN. onsider                            oncology consult even though she does not think she                            wants chemotherapy                           - Check liver enzymes (AST, ALT, alkaline                            phosphatase, bilirubin) and hemogram with white                            blood cell count and  platelets tomorrow. if she                            continues to have symptoms of obsructive jaundice                            consider retry ERCP withadditional stents or PTC                            for external drainage but I would give this stent a                            chance to see how well and will work and will                            follow with you and check on tomorrow Procedure Code(s):        --- Professional ---                           [id number], Esophagogastroduodenoscopy, flexible,                            transoral; diagnostic, including collection of                            specimen(s) by brushing or washing, when performed                            (separate procedure) Diagnosis Code(s):        --- Professional ---                           K26.9, Duodenal ulcer, unspecified as acute or                            chronic, without hemorrhage or perforation                           K83.1, Obstruction of bile duct                           C23, Malignant neoplasm of gallbladder                           C22.1, Intrahepatic bile duct carcinoma                           R93.5, Abnormal findings on  diagnostic imaging of                            other abdominal regions, including retroperitoneum                           K83.8, Other specified diseases of biliary tract  R93.2, Abnormal findings on diagnostic imaging of                            liver and biliary tract CPT copyright 2016 [employer]. All rights reserved. The codes documented in this report are preliminary and upon coder review may  be revised to meet current compliance requirements. Clarene Essex, MD 11/28/2016 1:51:10 PM This report has been signed electronically. Number of Addenda: 0

## 2016-11-29 ENCOUNTER — Inpatient Hospital Stay (HOSPITAL_COMMUNITY): Payer: Medicare HMO

## 2016-11-29 ENCOUNTER — Encounter (HOSPITAL_COMMUNITY): Payer: Self-pay | Admitting: Gastroenterology

## 2016-11-29 DIAGNOSIS — C801 Malignant (primary) neoplasm, unspecified: Secondary | ICD-10-CM

## 2016-11-29 DIAGNOSIS — Z515 Encounter for palliative care: Secondary | ICD-10-CM

## 2016-11-29 DIAGNOSIS — R109 Unspecified abdominal pain: Secondary | ICD-10-CM

## 2016-11-29 DIAGNOSIS — K831 Obstruction of bile duct: Secondary | ICD-10-CM

## 2016-11-29 HISTORY — PX: IR US GUIDE BX ASP/DRAIN: IMG2392

## 2016-11-29 LAB — IRON AND TIBC
Iron: 23 ug/dL — ABNORMAL LOW (ref 28–170)
SATURATION RATIOS: 13 % (ref 10.4–31.8)
TIBC: 181 ug/dL — AB (ref 250–450)
UIBC: 158 ug/dL

## 2016-11-29 LAB — PREALBUMIN: PREALBUMIN: 13.8 mg/dL — AB (ref 18–38)

## 2016-11-29 LAB — CBC WITH DIFFERENTIAL/PLATELET
Basophils Absolute: 0 10*3/uL (ref 0.0–0.1)
Basophils Relative: 0 %
Eosinophils Absolute: 0 10*3/uL (ref 0.0–0.7)
Eosinophils Relative: 0 %
HCT: 27.6 % — ABNORMAL LOW (ref 36.0–46.0)
Hemoglobin: 9.1 g/dL — ABNORMAL LOW (ref 12.0–15.0)
Lymphocytes Relative: 1 %
Lymphs Abs: 0.1 10*3/uL — ABNORMAL LOW (ref 0.7–4.0)
MCH: 29.3 pg (ref 26.0–34.0)
MCHC: 33 g/dL (ref 30.0–36.0)
MCV: 88.7 fL (ref 78.0–100.0)
Monocytes Absolute: 0.6 10*3/uL (ref 0.1–1.0)
Monocytes Relative: 4 %
Neutro Abs: 14.2 10*3/uL — ABNORMAL HIGH (ref 1.7–7.7)
Neutrophils Relative %: 95 %
Platelets: 371 10*3/uL (ref 150–400)
RBC: 3.11 MIL/uL — ABNORMAL LOW (ref 3.87–5.11)
RDW: 16.7 % — ABNORMAL HIGH (ref 11.5–15.5)
WBC: 14.9 10*3/uL — ABNORMAL HIGH (ref 4.0–10.5)

## 2016-11-29 LAB — COMPREHENSIVE METABOLIC PANEL WITH GFR
ALT: 332 U/L — ABNORMAL HIGH (ref 14–54)
AST: 574 U/L — ABNORMAL HIGH (ref 15–41)
Albumin: 2.4 g/dL — ABNORMAL LOW (ref 3.5–5.0)
Alkaline Phosphatase: 767 U/L — ABNORMAL HIGH (ref 38–126)
Anion gap: 9 (ref 5–15)
BUN: 45 mg/dL — ABNORMAL HIGH (ref 6–20)
CO2: 15 mmol/L — ABNORMAL LOW (ref 22–32)
Calcium: 9 mg/dL (ref 8.9–10.3)
Chloride: 113 mmol/L — ABNORMAL HIGH (ref 101–111)
Creatinine, Ser: 1.92 mg/dL — ABNORMAL HIGH (ref 0.44–1.00)
GFR calc Af Amer: 27 mL/min — ABNORMAL LOW
GFR calc non Af Amer: 24 mL/min — ABNORMAL LOW
Glucose, Bld: 126 mg/dL — ABNORMAL HIGH (ref 65–99)
Potassium: 5.2 mmol/L — ABNORMAL HIGH (ref 3.5–5.1)
Sodium: 137 mmol/L (ref 135–145)
Total Bilirubin: 7.9 mg/dL — ABNORMAL HIGH (ref 0.3–1.2)
Total Protein: 5.8 g/dL — ABNORMAL LOW (ref 6.5–8.1)

## 2016-11-29 LAB — FERRITIN: Ferritin: 1101 ng/mL — ABNORMAL HIGH (ref 11–307)

## 2016-11-29 MED ORDER — LIDOCAINE HCL (PF) 1 % IJ SOLN
INTRAMUSCULAR | Status: AC | PRN
  Administered 2016-11-29: 15 mL

## 2016-11-29 MED ORDER — SIMETHICONE 80 MG PO CHEW
160.0000 mg | CHEWABLE_TABLET | Freq: Once | ORAL | Status: AC
  Administered 2016-11-29: 160 mg via ORAL
  Filled 2016-11-29: qty 2

## 2016-11-29 MED ORDER — MIDAZOLAM HCL 2 MG/2ML IJ SOLN
INTRAMUSCULAR | Status: AC | PRN
  Administered 2016-11-29: 0.5 mg via INTRAVENOUS

## 2016-11-29 MED ORDER — FENTANYL CITRATE (PF) 100 MCG/2ML IJ SOLN
25.0000 ug | INTRAMUSCULAR | Status: DC | PRN
  Administered 2016-11-29: 25 ug via INTRAVENOUS
  Filled 2016-11-29: qty 2

## 2016-11-29 MED ORDER — DIPHENHYDRAMINE HCL 12.5 MG/5ML PO ELIX
12.5000 mg | ORAL_SOLUTION | Freq: Once | ORAL | Status: AC | PRN
  Administered 2016-11-29: 12.5 mg via ORAL
  Filled 2016-11-29: qty 5

## 2016-11-29 MED ORDER — GELATIN ABSORBABLE 12-7 MM EX MISC
CUTANEOUS | Status: AC
  Administered 2016-11-29: 15:00:00
  Filled 2016-11-29: qty 1

## 2016-11-29 MED ORDER — SODIUM POLYSTYRENE SULFONATE 15 GM/60ML PO SUSP
15.0000 g | Freq: Once | ORAL | Status: AC
  Administered 2016-11-29: 15 g via ORAL
  Filled 2016-11-29: qty 60

## 2016-11-29 MED ORDER — LIDOCAINE HCL 1 % IJ SOLN
INTRAMUSCULAR | Status: AC
  Administered 2016-11-29: 15:00:00
  Filled 2016-11-29: qty 20

## 2016-11-29 MED ORDER — GELATIN ABSORBABLE 12-7 MM EX MISC
CUTANEOUS | Status: AC | PRN
  Administered 2016-11-29: 1 via TOPICAL

## 2016-11-29 MED ORDER — FENTANYL CITRATE (PF) 100 MCG/2ML IJ SOLN
INTRAMUSCULAR | Status: AC
  Administered 2016-11-29: 25 ug via INTRAVENOUS
  Filled 2016-11-29: qty 2

## 2016-11-29 MED ORDER — FENTANYL CITRATE (PF) 100 MCG/2ML IJ SOLN
INTRAMUSCULAR | Status: AC | PRN
  Administered 2016-11-29: 25 ug via INTRAVENOUS

## 2016-11-29 MED ORDER — MIDAZOLAM HCL 2 MG/2ML IJ SOLN
INTRAMUSCULAR | Status: AC
  Administered 2016-11-29: 15:00:00
  Filled 2016-11-29: qty 2

## 2016-11-29 NOTE — Progress Notes (Signed)
Shela Commons 10:32 AM  Subjective: Patient did have a little pain last night but doing better today and is tolerating clear liquids and she was glad to see her oncologist and we discussed her procedure yesterday and her biopsy today and she has no new complaints  Objective: Vital signs stable afebrile no acute distress abdomen is a little upper tenderness and firm occasional bowel sounds soft nontender lower less pain than yesterday bilirubin decreased other liver tests about the same white count slight increase  Assessment: Gallbladder versus cholangiocarcinoma with multiple areas of duct obstruction status post 1 stent yesterday  Plan: We will continue to follow and see how stent eventually works and might need retry for other decompression.  ERCP or PTC and await biopsy as well and may advance diet as tolerated from my standpoint  [facility]  Pager [phone] After 5PM or if no answer call [phone]

## 2016-11-29 NOTE — Care Management Important Message (Addendum)
Important Message  Patient Details IM Letter given to Nora/Case Manager to present to Patient Name: AIYANA STEGMANN MRN: [id number] Date of Birth: [birth year]   Medicare Important Message Given:  Yes    Kerin Salen 11/29/2016, 10:54 AMImportant Message  Patient Details  Name: MILAH RECHT MRN: [id number] Date of Birth: 04-17-1934   Medicare Important Message Given:  Yes    Kerin Salen 11/29/2016, 10:54 AM

## 2016-11-29 NOTE — Procedures (Signed)
Liver lesion Bx 18 g times four EBL 0 Comp 0

## 2016-11-29 NOTE — Progress Notes (Signed)
Referring Physician(s): Ennever,P  Supervising Physician: Marybelle Killings  Patient Status:  [facility] - In-pt  Chief Complaint: Biliary obstruction, liver masses   Subjective: Pt familiar to IR service from prior bilat PCN's /ureteral stent placements in 2008. She now presents with abdominal pain, nausea, weight loss, jaundice and findings of dominant infiltrative mass GB neck as well as scattered liver masses. She underwent ERCP yesterday which revealed multiple nonbleeding duodenal ulcers, biliary stricture treated with sphincterotomy . She also had plastic stent placed into ventral pancreatic duct and uncovered metal stent placed into CBD. CA 19-9 is markedly elevated. Request received for image guided liver lesion biopsy for further evaluation.    Past Medical History:  Diagnosis Date  . Chronic kidney disease   . Hypertension    Past Surgical History:  Procedure Laterality Date  . ABDOMINAL HYSTERECTOMY    . APPENDECTOMY    . HERNIA REPAIR    . prolapsed bladder surgery    . TONSILLECTOMY        Allergies: Airborne [multiple vitamins-minerals]; Amlodipine besylate; Amoxicillin; Bactrim [sulfamethoxazole-trimethoprim]; Benicar [olmesartan]; Benzonatate; Coreg [carvedilol]; Diltiazem hcl; Endal cd [phenyleph-chlorphen-codeine]; Fexofenadine; Furosemide; Ibuprofen; Lisinopril; Loratadine; Losartan potassium; Monurol [fosfomycin tromethamine]; Omeprazole; Orudis [ketoprofen]; Penicillins; and Ranitidine hcl  Medications: Prior to Admission medications   Medication Sig Start Date End Date Taking? Authorizing Provider  acetaminophen (TYLENOL) 500 MG tablet Take 500 mg by mouth 2 (two) times daily.   Yes [provider]  Calcium-Magnesium-Vitamin D (CALCIUM 1200+D3 PO) Take 2 tablets by mouth daily.   Yes [provider]  co-enzyme Q-10 30 MG capsule Take 30 mg by mouth daily.   Yes [provider]  Garlic 962 MG CAPS Take 2 capsules by mouth daily.   Yes  [provider]  glucosamine-chondroitin 500-400 MG tablet Take 2 tablets by mouth daily.   Yes [provider]  LECITHIN PO Take 1 capsule by mouth daily.   Yes [provider]  Multiple Vitamin (MULTIVITAMIN WITH MINERALS) TABS tablet Take 1 tablet by mouth daily.   Yes [provider]  thiamine (VITAMIN B-1) 100 MG tablet Take 100 mg by mouth daily.   Yes [provider]  vitamin E 200 UNIT capsule Take 200 Units by mouth daily.   Yes [provider]     Vital Signs: BP (!) 155/84 (BP Location: Left Arm)   Pulse 67   Temp 97.9 F (36.6 C) (Oral)   Resp 18   Ht 4\' 10"  (1.473 m)   Wt 109 lb 2 oz (49.5 kg)   SpO2 98%   BMI 22.81 kg/m   Physical Exam awake, mild forgetfulness; scleral icterus;chest- CTA bilat; heart- RRR; abd- soft,+BS, not sig tender, enlarged liver; no LE edema  Imaging: Ct Abdomen Pelvis Wo Contrast  Result Date: 11/26/2016 CLINICAL DATA:  Abdominal pain for a week. EXAM: CT ABDOMEN AND PELVIS WITHOUT CONTRAST TECHNIQUE: Multidetector CT imaging of the abdomen and pelvis was performed following the standard protocol without IV contrast. COMPARISON:  07/13/2009 pelvis CT. FINDINGS: Lower chest: Calcified granulomas in the right lower lobe. Small to moderate sliding hiatal hernia. Hepatobiliary: Severe intra and extrahepatic bile duct dilatation. There are superimposed subtle low-density masses throughout the liver.There is bulky masslike appearance at the hepatic hilum and upper gallbladder fossa where the gallbladder neck and the common bile duct are not visible. Cholelithiasis. The gallbladder is markedly dilated without inflammatory wall thickening. Pancreas: The pancreas appears displaced by the mass. No main duct dilatation to suggest  primary pancreatic lesion. Spleen: Granulomatous calcifications. Adrenals/Urinary Tract: Negative adrenals. Atrophic kidneys with presumed cystic densities. No hydronephrosis.  Unremarkable bladder. Stomach/Bowel:  No obstruction. No inflammatory changes. Vascular/Lymphatic: Soft tissue density at the hepatic hilum is likely both primary mass and adenopathy. No distant adenopathy in the deep liver drainage. Mild atherosclerotic calcification for age. Reproductive:Hysterectomy Other: No ascites or pneumoperitoneum. Musculoskeletal: No acute abnormalities. Remote L3 and L4 compression fractures. Spinal degeneration and mild scoliosis. Osteopenia. IMPRESSION: Large mass at the hepatic hilum/gallbladder fossa, favor cholangiocarcinoma. There is severe bile duct and gallbladder obstruction. Multiple ill-defined low-density masses throughout the liver attributed to metastatic disease. Electronically Signed   By: Monte Fantasia M.D.   On: 11/26/2016 13:04   Mr Abdomen Mrcp Wo Contrast  Result Date: 11/28/2016 CLINICAL DATA:  82 year old female inpatient with jaundice, biliary obstruction and multiple hepatic masses on noncontrast CT. EXAM: MRI ABDOMEN WITHOUT CONTRAST  (INCLUDING MRCP) TECHNIQUE: Multiplanar multisequence MR imaging of the abdomen was performed. Heavily T2-weighted images of the biliary and pancreatic ducts were obtained, and three-dimensional MRCP images were rendered by post processing. COMPARISON:  11/26/2016 unenhanced CT abdomen/pelvis. FINDINGS: Lower chest: Mild hypoventilatory changes in the dependent lung bases. Hepatobiliary: The liver is enlarged by numerous (greater than 10) mildly T2 hyperintense similar-appearing masses scattered throughout the liver, with mild internal heterogeneity. Representative liver masses include a 4.8 x 3.4 cm segment 8 right liver lobe mass (series 8/ image 12) and a 7.0 x 4.5 cm segment 6 right liver lobe mass (series 8/ image 23). No hepatic steatosis. There is a dominant 8.3 x 5.2 cm infiltrative mass centered at the gallbladder neck (series 8/ image 30), which encases and narrows the gallbladder neck and which encases and occludes  the biliary confluence of the right and left hepatic ducts and common hepatic duct. Marked diffuse intrahepatic biliary ductal dilatation. Gallbladder is prominently distended with layering gallstones measuring up to 2.7 cm in size. No definite gallbladder wall thickening or significant pericholecystic fluid. The distal half of the common bile duct is normal caliber (4 mm diameter). Pancreas: There are several small pancreatic cystic lesions scattered throughout the pancreas, largest 1.5 x 0.7 x 1.3 cm in the inferior distal pancreatic body (series 8/ image 25), most of which demonstrate lobulated outer contours and a few of which demonstrate thin internal septations, with no apparent solid components. No significant main pancreatic duct dilation. No pancreas divisum. Spleen: Normal size. No mass. Adrenals/Urinary Tract: No discrete adrenal nodules. No hydronephrosis. Several simple appearing T2 hyperintense renal cysts in both kidneys, largest 1.8 cm in the interpolar right kidney. No overtly suspicious renal masses. Symmetric moderate to severe bilateral renal parenchymal atrophy. Stomach/Bowel: Moderate hiatal hernia. Otherwise normal nondistended stomach. Visualized small and large bowel is normal caliber, with no bowel wall thickening. Vascular/Lymphatic: Nonaneurysmal abdominal aorta. Preserved flow voids in the portal veins, noting extrinsic narrowing of the main portal vein by the dominant mass (series 8/ image 25). No pathologically enlarged lymph nodes in the abdomen. Other: No abdominal ascites or focal fluid collection. Musculoskeletal: No aggressive appearing focal osseous lesions. IMPRESSION: 1. Dominant 8.3 cm infiltrative mass centered at the gallbladder neck, which encases and occludes the biliary confluence in the porta hepatis with marked diffuse intrahepatic biliary ductal dilatation. Differential includes cholangiocarcinoma or primary gallbladder carcinoma. 2. Numerous similar-appearing mildly  T2 hyperintense masses scattered throughout the liver, compatible with hepatic metastatic disease. 3. Extrinsic narrowing by the mass of the main portal vein, which appears remain patent . 4. Cholelithiasis.  Marked gallbladder distention by the dominant mass. 5. Numerous small pancreatic cystic lesions, largest 1.5 cm in the distal pancreatic body, without overt high risk features on this noncontrast MRI study. 6. Moderate hiatal hernia. 7. Advanced symmetric renal atrophy. Electronically Signed   By: Ilona Sorrel M.D.   On: 11/28/2016 08:38   Mr 3d Recon At Scanner  Result Date: 11/28/2016 CLINICAL DATA:  82 year old female inpatient with jaundice, biliary obstruction and multiple hepatic masses on noncontrast CT. EXAM: MRI ABDOMEN WITHOUT CONTRAST  (INCLUDING MRCP) TECHNIQUE: Multiplanar multisequence MR imaging of the abdomen was performed. Heavily T2-weighted images of the biliary and pancreatic ducts were obtained, and three-dimensional MRCP images were rendered by post processing. COMPARISON:  11/26/2016 unenhanced CT abdomen/pelvis. FINDINGS: Lower chest: Mild hypoventilatory changes in the dependent lung bases. Hepatobiliary: The liver is enlarged by numerous (greater than 10) mildly T2 hyperintense similar-appearing masses scattered throughout the liver, with mild internal heterogeneity. Representative liver masses include a 4.8 x 3.4 cm segment 8 right liver lobe mass (series 8/ image 12) and a 7.0 x 4.5 cm segment 6 right liver lobe mass (series 8/ image 23). No hepatic steatosis. There is a dominant 8.3 x 5.2 cm infiltrative mass centered at the gallbladder neck (series 8/ image 30), which encases and narrows the gallbladder neck and which encases and occludes the biliary confluence of the right and left hepatic ducts and common hepatic duct. Marked diffuse intrahepatic biliary ductal dilatation. Gallbladder is prominently distended with layering gallstones measuring up to 2.7 cm in size. No  definite gallbladder wall thickening or significant pericholecystic fluid. The distal half of the common bile duct is normal caliber (4 mm diameter). Pancreas: There are several small pancreatic cystic lesions scattered throughout the pancreas, largest 1.5 x 0.7 x 1.3 cm in the inferior distal pancreatic body (series 8/ image 25), most of which demonstrate lobulated outer contours and a few of which demonstrate thin internal septations, with no apparent solid components. No significant main pancreatic duct dilation. No pancreas divisum. Spleen: Normal size. No mass. Adrenals/Urinary Tract: No discrete adrenal nodules. No hydronephrosis. Several simple appearing T2 hyperintense renal cysts in both kidneys, largest 1.8 cm in the interpolar right kidney. No overtly suspicious renal masses. Symmetric moderate to severe bilateral renal parenchymal atrophy. Stomach/Bowel: Moderate hiatal hernia. Otherwise normal nondistended stomach. Visualized small and large bowel is normal caliber, with no bowel wall thickening. Vascular/Lymphatic: Nonaneurysmal abdominal aorta. Preserved flow voids in the portal veins, noting extrinsic narrowing of the main portal vein by the dominant mass (series 8/ image 25). No pathologically enlarged lymph nodes in the abdomen. Other: No abdominal ascites or focal fluid collection. Musculoskeletal: No aggressive appearing focal osseous lesions. IMPRESSION: 1. Dominant 8.3 cm infiltrative mass centered at the gallbladder neck, which encases and occludes the biliary confluence in the porta hepatis with marked diffuse intrahepatic biliary ductal dilatation. Differential includes cholangiocarcinoma or primary gallbladder carcinoma. 2. Numerous similar-appearing mildly T2 hyperintense masses scattered throughout the liver, compatible with hepatic metastatic disease. 3. Extrinsic narrowing by the mass of the main portal vein, which appears remain patent . 4. Cholelithiasis. Marked gallbladder distention  by the dominant mass. 5. Numerous small pancreatic cystic lesions, largest 1.5 cm in the distal pancreatic body, without overt high risk features on this noncontrast MRI study. 6. Moderate hiatal hernia. 7. Advanced symmetric renal atrophy. Electronically Signed   By: Ilona Sorrel M.D.   On: 11/28/2016 08:38   Dg Ercp Biliary & Pancreatic Ducts  Result Date: 11/28/2016 CLINICAL  DATA:  Biliary obstruction EXAM: ERCP TECHNIQUE: Multiple spot images obtained with the fluoroscopic device and submitted for interpretation post-procedure. FLUOROSCOPY TIME:  Fluoroscopy Time:  20 minutes and 46 seconds Radiation Exposure Index (if provided by the fluoroscopic device): Not given Number of Acquired Spot Images: 20 COMPARISON:  None. FINDINGS: Contrast fills the biliary tree. Balloon stone retrieval is documented. A stent has been placed across the mid biliary tree and porta hepatis. IMPRESSION: See above. These images were submitted for radiologic interpretation only. Please see the procedural report for the amount of contrast and the fluoroscopy time utilized. Electronically Signed   By: Marybelle Killings M.D.   On: 11/28/2016 13:47    Labs:  CBC:  Recent Labs  11/26/16 1133 11/27/16 0412 11/28/16 0408 11/29/16 0356  WBC 12.0* 11.3* 11.5* 14.9*  HGB 10.5* 9.6* 8.8* 9.1*  HCT 30.6* 28.7* 26.5* 27.6*  PLT 376 365 329 371    COAGS:  Recent Labs  11/26/16 1056 11/27/16 0932  INR 1.20 1.19  APTT 34  --     BMP:  Recent Labs  11/26/16 1133 11/27/16 0412 11/28/16 0408 11/29/16 0356  NA 132* 136 137 137  K 3.7 3.8 4.4 5.2*  CL 101 110 113* 113*  CO2 14* 14* 14* 15*  GLUCOSE 120* 123* 115* 126*  BUN 65* 50* 40* 45*  CALCIUM 9.7 9.1 9.0 9.0  CREATININE 2.46* 1.88* 1.79* 1.92*  GFRNONAA 17* 24* 26* 24*  GFRAA 20* 28* 30* 27*    LIVER FUNCTION TESTS:  Recent Labs  11/26/16 1133 11/27/16 0412 11/28/16 0408 11/29/16 0356  BILITOT 10.3* 10.3* 11.4* 7.9*  AST 553* 472* 413* 574*    ALT 429* 328* 292* 332*  ALKPHOS 798* 780* 758* 767*  PROT 7.3 6.4* 5.8* 5.8*  ALBUMIN 3.2* 2.8* 2.6* 2.4*    Assessment and Plan: Pt with hx abdominal pain, nausea, weight loss, jaundice and findings of dominant infiltrative mass GB neck as well as scattered liver masses. She underwent ERCP yesterday which revealed multiple nonbleeding duodenal ulcers, biliary stricture treated with sphincterotomy . She also had plastic stent placed into ventral pancreatic duct and uncovered metal stent placed into CBD. CA 19-9 is markedly elevated. Request received for image guided liver lesion biopsy for further evaluation. Imaging studies were reviewed by Dr. Barbie Banner. Risks and benefits discussed with the patient/daughter including, but not limited to bleeding, infection, damage to adjacent structures or low yield requiring additional tests.All of the patient's questions were answered, patient is agreeable to proceed.Consent signed and in chart. Procedure tent planned for later today.      Electronically Signed: D. Rowe Robert, PA-C 11/29/2016, 9:41 AM   I spent a total of 25 minutes at the the patient's bedside AND on the patient's hospital floor or unit, greater than 50% of which was counseling/coordinating care for image guided liver lesion biopsy    Patient ID: [name], female   DOB: 06-28-1934, 82 y.o.   MRN: [id number]

## 2016-11-29 NOTE — Progress Notes (Signed)
@IPLOG @        PROGRESS NOTE                                                                                                                                                                                                             Patient Demographics:    [name], is a 82 y.o. female, DOB - 1934/11/24, WFU:[id number]  Admit date - 11/26/2016   Admitting Physician Rise Patience, MD  Outpatient Primary MD for the patient is Patient, No Pcp Per  LOS - 3  Chief Complaint  Patient presents with  . Abdominal Pain       Brief Narrative  [name] is a 82 y.o. female with history of hypertension and chronic kidney disease and anemia who has not been to a doctor for many years presents to the ER with complaints of worsening abdominal pain.  Patient states she has been doing abdominal pain for the last 2-3 months which has acutely worsened last 1 week.  Patient also has been having nausea vomiting.  Pain is mostly in the mid abdomen radiating to the back and chest.  Denies any blood in the vomitus.  Has had bowel movement last one today.  Patient also noticed jaundice.  Denies fever chills.  Further workup suggests possible gallbladder malignancy.   Subjective:   Patient in bed, appears comfortable, denies any headache, no fever, no chest pain or pressure, no shortness of breath , no abdominal pain. No focal weakness.    Assessment  & Plan :     1.  Abdominal pain with concern for primary gallbladder cancer versus cholangiocarcinoma with possible metastases to the liver and possibly pancreas.  Currently stable, pain and fever free, on bowel rest, IV fluids, MRCP noted, GI, general surgery on board.  Underwent ERCP on 11/28/2016 again findings suggestive of gallbladder neck malignancy, 2 stents were deployed one in the CBD other in the pancreatic duct to relieve obstruction, she also has suspicious appearing spots in the liver noted on MRCP for which she is undergoing  CT-guided biopsy on 11/29/2016.    She was initially hesitant to consider chemotherapy but Dr. Marin Olp is talking to her and her family about palliative chemo.  Palliative care team also requested to assist.  2.  Hypertension.  As needed hydralazine.  3.  CKD stage III.  Stable.  Continue to hydrate and monitor.  4.  AOCD.  No acute issues.  5.  Mild hyperkalemia.  1 dose of Kayexalate repeat BMP tomorrow.  Gentle  hydration.    Diet : Diet NPO time specified Except for: Sips with Meds   Family Communication  :  None  Code Status :  Full  Disposition Plan  : Stay in the hospital  Consults  : GI, general surgery, oncology, palliative care  Procedures  :    CT-guided liver biopsy due.    ERCP. 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  CT abdomen pelvis.  Possible cholangiocarcinoma.  MRCP.   1. Dominant 8.3 cm infiltrative mass centered at the gallbladder neck, which encases and occludes the biliary confluence in the porta hepatis with marked diffuse intrahepatic biliary ductal dilatation. Differential includes cholangiocarcinoma or primary gallbladder carcinoma. 2. Numerous similar-appearing mildly T2 hyperintense masses scattered throughout the liver, compatible with hepatic metastatic disease. 3. Extrinsic narrowing by the mass of the main portal vein, which appears remain patent . 4. Cholelithiasis. Marked gallbladder distention by the dominant mass. 5. Numerous small pancreatic cystic lesions, largest 1.5 cm in the distal pancreatic body, without overt high risk features on this noncontrast MRI study. 6. Moderate hiatal hernia. 7. Advanced symmetric renal atrophy.  DVT Prophylaxis  : Heparin  Lab Results  Component Value Date   PLT 371 11/29/2016    Inpatient Medications  Scheduled Meds: . Chlorhexidine Gluconate Cloth  6 each Topical Daily  . heparin subcutaneous  5,000  Units Subcutaneous Q8H  . mupirocin ointment  1 application Nasal BID  . sodium polystyrene  15 g Oral Once   Continuous Infusions: . dextrose 5 % and 0.9% NaCl 50 mL/hr at 11/28/16 1512   PRN Meds:.fentaNYL (SUBLIMAZE) injection, hydrALAZINE, ondansetron **OR** ondansetron (ZOFRAN) IV  Antibiotics  :    Anti-infectives    Start     Dose/Rate Route Frequency Ordered Stop   11/28/16 1215  ciprofloxacin (CIPRO) IVPB 400 mg     400 mg 200 mL/hr over 60 Minutes Intravenous  Once 11/28/16 1207 11/28/16 1253         Objective:   Vitals:   11/28/16 1743 11/28/16 2051 11/29/16 0215 11/29/16 0532  BP: (!) 167/62 (!) 170/81 134/69 (!) 155/84  Pulse:  72  67  Resp:  18  18  Temp:  98 F (36.7 C)  97.9 F (36.6 C)  TempSrc:  Oral  Oral  SpO2:  94% 96% 98%  Weight:      Height:        Wt Readings from Last 3 Encounters:  11/28/16 49.5 kg (109 lb 2 oz)  01/12/09 58.5 kg (129 lb)  12/12/08 58.5 kg (129 lb)     Intake/Output Summary (Last 24 hours) at 11/29/16 1105 Last data filed at 11/29/16 0902  Gross per 24 hour  Intake              810 ml  Output                0 ml  Net              810 ml     Physical Exam  Awake Alert, Oriented X 3, No new F.N deficits, Normal affect [place].AT, does appear jaundiced with scleral icterus, Supple Neck, No JVD, No cervical lymphadenopathy appriciated.  Symmetrical Chest wall movement, Good air movement bilaterally, CTAB RRR,No Gallops, Rubs or new Murmurs, No Parasternal Heave +ve B.Sounds, Abd Soft, mild right upper quadrant tenderness, No organomegaly appriciated, No rebound - guarding or rigidity. No  Cyanosis, Clubbing or edema, No new Rash or bruise     Data Review:    CBC  Recent Labs Lab 11/26/16 1133 11/27/16 0412 11/28/16 0408 11/29/16 0356  WBC 12.0* 11.3* 11.5* 14.9*  HGB 10.5* 9.6* 8.8* 9.1*  HCT 30.6* 28.7* 26.5* 27.6*  PLT 376 365 329 371  MCV 85.7 86.7 88.0 88.7  MCH 29.4 29.0 29.2 29.3  MCHC 34.3 33.4  33.2 33.0  RDW 15.0 15.2 15.8* 16.7*  LYMPHSABS  --   --   --  0.1*  MONOABS  --   --   --  0.6  EOSABS  --   --   --  0.0  BASOSABS  --   --   --  0.0    Chemistries   Recent Labs Lab 11/26/16 1133 11/27/16 0412 11/28/16 0408 11/29/16 0356  NA 132* 136 137 137  K 3.7 3.8 4.4 5.2*  CL 101 110 113* 113*  CO2 14* 14* 14* 15*  GLUCOSE 120* 123* 115* 126*  BUN 65* 50* 40* 45*  CREATININE 2.46* 1.88* 1.79* 1.92*  CALCIUM 9.7 9.1 9.0 9.0  MG  --   --  1.8  --   AST 553* 472* 413* 574*  ALT 429* 328* 292* 332*  ALKPHOS 798* 780* 758* 767*  BILITOT 10.3* 10.3* 11.4* 7.9*   ------------------------------------------------------------------------------------------------------------------ No results for input(s): CHOL, HDL, LDLCALC, TRIG, CHOLHDL, LDLDIRECT in the last 72 hours.  No results found for: HGBA1C ------------------------------------------------------------------------------------------------------------------ No results for input(s): TSH, T4TOTAL, T3FREE, THYROIDAB in the last 72 hours.  Invalid input(s): FREET3 ------------------------------------------------------------------------------------------------------------------ No results for input(s): VITAMINB12, FOLATE, FERRITIN, TIBC, IRON, RETICCTPCT in the last 72 hours.  Coagulation profile  Recent Labs Lab 11/26/16 1056 11/27/16 0932  INR 1.20 1.19    No results for input(s): DDIMER in the last 72 hours.  Cardiac Enzymes No results for input(s): CKMB, TROPONINI, MYOGLOBIN in the last 168 hours.  Invalid input(s): CK ------------------------------------------------------------------------------------------------------------------ No results found for: BNP  Micro Results No results found for this or any previous visit (from the past 240 hour(s)).  Radiology Reports Ct Abdomen Pelvis Wo Contrast  Result Date: 11/26/2016 CLINICAL DATA:  Abdominal pain for a week. EXAM: CT ABDOMEN AND PELVIS WITHOUT  CONTRAST TECHNIQUE: Multidetector CT imaging of the abdomen and pelvis was performed following the standard protocol without IV contrast. COMPARISON:  07/13/2009 pelvis CT. FINDINGS: Lower chest: Calcified granulomas in the right lower lobe. Small to moderate sliding hiatal hernia. Hepatobiliary: Severe intra and extrahepatic bile duct dilatation. There are superimposed subtle low-density masses throughout the liver.There is bulky masslike appearance at the hepatic hilum and upper gallbladder fossa where the gallbladder neck and the common bile duct are not visible. Cholelithiasis. The gallbladder is markedly dilated without inflammatory wall thickening. Pancreas: The pancreas appears displaced by the mass. No main duct dilatation to suggest primary pancreatic lesion. Spleen: Granulomatous calcifications. Adrenals/Urinary Tract: Negative adrenals. Atrophic kidneys with presumed cystic densities. No hydronephrosis. Unremarkable bladder. Stomach/Bowel:  No obstruction. No inflammatory changes. Vascular/Lymphatic: Soft tissue density at the hepatic hilum is likely both primary mass and adenopathy. No distant adenopathy in the deep liver drainage. Mild atherosclerotic calcification for age. Reproductive:Hysterectomy Other: No ascites or pneumoperitoneum. Musculoskeletal: No acute abnormalities. Remote L3 and L4 compression fractures. Spinal degeneration and mild scoliosis. Osteopenia. IMPRESSION: Large mass at the hepatic hilum/gallbladder fossa, favor cholangiocarcinoma. There is severe bile duct and gallbladder obstruction. Multiple ill-defined low-density masses throughout the liver attributed to metastatic disease. Electronically Signed   By:  Monte Fantasia M.D.   On: 11/26/2016 13:04   Mr Abdomen Mrcp Wo Contrast  Result Date: 11/28/2016 CLINICAL DATA:  82 year old female inpatient with jaundice, biliary obstruction and multiple hepatic masses on noncontrast CT. EXAM: MRI ABDOMEN WITHOUT CONTRAST  (INCLUDING  MRCP) TECHNIQUE: Multiplanar multisequence MR imaging of the abdomen was performed. Heavily T2-weighted images of the biliary and pancreatic ducts were obtained, and three-dimensional MRCP images were rendered by post processing. COMPARISON:  11/26/2016 unenhanced CT abdomen/pelvis. FINDINGS: Lower chest: Mild hypoventilatory changes in the dependent lung bases. Hepatobiliary: The liver is enlarged by numerous (greater than 10) mildly T2 hyperintense similar-appearing masses scattered throughout the liver, with mild internal heterogeneity. Representative liver masses include a 4.8 x 3.4 cm segment 8 right liver lobe mass (series 8/ image 12) and a 7.0 x 4.5 cm segment 6 right liver lobe mass (series 8/ image 23). No hepatic steatosis. There is a dominant 8.3 x 5.2 cm infiltrative mass centered at the gallbladder neck (series 8/ image 30), which encases and narrows the gallbladder neck and which encases and occludes the biliary confluence of the right and left hepatic ducts and common hepatic duct. Marked diffuse intrahepatic biliary ductal dilatation. Gallbladder is prominently distended with layering gallstones measuring up to 2.7 cm in size. No definite gallbladder wall thickening or significant pericholecystic fluid. The distal half of the common bile duct is normal caliber (4 mm diameter). Pancreas: There are several small pancreatic cystic lesions scattered throughout the pancreas, largest 1.5 x 0.7 x 1.3 cm in the inferior distal pancreatic body (series 8/ image 25), most of which demonstrate lobulated outer contours and a few of which demonstrate thin internal septations, with no apparent solid components. No significant main pancreatic duct dilation. No pancreas divisum. Spleen: Normal size. No mass. Adrenals/Urinary Tract: No discrete adrenal nodules. No hydronephrosis. Several simple appearing T2 hyperintense renal cysts in both kidneys, largest 1.8 cm in the interpolar right kidney. No overtly suspicious  renal masses. Symmetric moderate to severe bilateral renal parenchymal atrophy. Stomach/Bowel: Moderate hiatal hernia. Otherwise normal nondistended stomach. Visualized small and large bowel is normal caliber, with no bowel wall thickening. Vascular/Lymphatic: Nonaneurysmal abdominal aorta. Preserved flow voids in the portal veins, noting extrinsic narrowing of the main portal vein by the dominant mass (series 8/ image 25). No pathologically enlarged lymph nodes in the abdomen. Other: No abdominal ascites or focal fluid collection. Musculoskeletal: No aggressive appearing focal osseous lesions. IMPRESSION: 1. Dominant 8.3 cm infiltrative mass centered at the gallbladder neck, which encases and occludes the biliary confluence in the porta hepatis with marked diffuse intrahepatic biliary ductal dilatation. Differential includes cholangiocarcinoma or primary gallbladder carcinoma. 2. Numerous similar-appearing mildly T2 hyperintense masses scattered throughout the liver, compatible with hepatic metastatic disease. 3. Extrinsic narrowing by the mass of the main portal vein, which appears remain patent . 4. Cholelithiasis. Marked gallbladder distention by the dominant mass. 5. Numerous small pancreatic cystic lesions, largest 1.5 cm in the distal pancreatic body, without overt high risk features on this noncontrast MRI study. 6. Moderate hiatal hernia. 7. Advanced symmetric renal atrophy. Electronically Signed   By: Ilona Sorrel M.D.   On: 11/28/2016 08:38   Mr 3d Recon At Scanner  Result Date: 11/28/2016 CLINICAL DATA:  82 year old female inpatient with jaundice, biliary obstruction and multiple hepatic masses on noncontrast CT. EXAM: MRI ABDOMEN WITHOUT CONTRAST  (INCLUDING MRCP) TECHNIQUE: Multiplanar multisequence MR imaging of the abdomen was performed. Heavily T2-weighted images of the biliary and pancreatic ducts were obtained, and  three-dimensional MRCP images were rendered by post processing. COMPARISON:   11/26/2016 unenhanced CT abdomen/pelvis. FINDINGS: Lower chest: Mild hypoventilatory changes in the dependent lung bases. Hepatobiliary: The liver is enlarged by numerous (greater than 10) mildly T2 hyperintense similar-appearing masses scattered throughout the liver, with mild internal heterogeneity. Representative liver masses include a 4.8 x 3.4 cm segment 8 right liver lobe mass (series 8/ image 12) and a 7.0 x 4.5 cm segment 6 right liver lobe mass (series 8/ image 23). No hepatic steatosis. There is a dominant 8.3 x 5.2 cm infiltrative mass centered at the gallbladder neck (series 8/ image 30), which encases and narrows the gallbladder neck and which encases and occludes the biliary confluence of the right and left hepatic ducts and common hepatic duct. Marked diffuse intrahepatic biliary ductal dilatation. Gallbladder is prominently distended with layering gallstones measuring up to 2.7 cm in size. No definite gallbladder wall thickening or significant pericholecystic fluid. The distal half of the common bile duct is normal caliber (4 mm diameter). Pancreas: There are several small pancreatic cystic lesions scattered throughout the pancreas, largest 1.5 x 0.7 x 1.3 cm in the inferior distal pancreatic body (series 8/ image 25), most of which demonstrate lobulated outer contours and a few of which demonstrate thin internal septations, with no apparent solid components. No significant main pancreatic duct dilation. No pancreas divisum. Spleen: Normal size. No mass. Adrenals/Urinary Tract: No discrete adrenal nodules. No hydronephrosis. Several simple appearing T2 hyperintense renal cysts in both kidneys, largest 1.8 cm in the interpolar right kidney. No overtly suspicious renal masses. Symmetric moderate to severe bilateral renal parenchymal atrophy. Stomach/Bowel: Moderate hiatal hernia. Otherwise normal nondistended stomach. Visualized small and large bowel is normal caliber, with no bowel wall thickening.  Vascular/Lymphatic: Nonaneurysmal abdominal aorta. Preserved flow voids in the portal veins, noting extrinsic narrowing of the main portal vein by the dominant mass (series 8/ image 25). No pathologically enlarged lymph nodes in the abdomen. Other: No abdominal ascites or focal fluid collection. Musculoskeletal: No aggressive appearing focal osseous lesions. IMPRESSION: 1. Dominant 8.3 cm infiltrative mass centered at the gallbladder neck, which encases and occludes the biliary confluence in the porta hepatis with marked diffuse intrahepatic biliary ductal dilatation. Differential includes cholangiocarcinoma or primary gallbladder carcinoma. 2. Numerous similar-appearing mildly T2 hyperintense masses scattered throughout the liver, compatible with hepatic metastatic disease. 3. Extrinsic narrowing by the mass of the main portal vein, which appears remain patent . 4. Cholelithiasis. Marked gallbladder distention by the dominant mass. 5. Numerous small pancreatic cystic lesions, largest 1.5 cm in the distal pancreatic body, without overt high risk features on this noncontrast MRI study. 6. Moderate hiatal hernia. 7. Advanced symmetric renal atrophy. Electronically Signed   By: Ilona Sorrel M.D.   On: 11/28/2016 08:38   Dg Ercp Biliary & Pancreatic Ducts  Result Date: 11/28/2016 CLINICAL DATA:  Biliary obstruction EXAM: ERCP TECHNIQUE: Multiple spot images obtained with the fluoroscopic device and submitted for interpretation post-procedure. FLUOROSCOPY TIME:  Fluoroscopy Time:  20 minutes and 46 seconds Radiation Exposure Index (if provided by the fluoroscopic device): Not given Number of Acquired Spot Images: 20 COMPARISON:  None. FINDINGS: Contrast fills the biliary tree. Balloon stone retrieval is documented. A stent has been placed across the mid biliary tree and porta hepatis. IMPRESSION: See above. These images were submitted for radiologic interpretation only. Please see the procedural report for the  amount of contrast and the fluoroscopy time utilized. Electronically Signed   By: Rodena Goldmann.D.  On: 11/28/2016 13:47    Time Spent in minutes  30   Lala Lund M.D on 11/29/2016 at 11:05 AM  Between 7am to 7pm - Pager - [phone] ( page via [email or web address], text pages only, please mention full 10 digit call back number). After 7pm go to [email or web address] - password [facility]

## 2016-11-29 NOTE — Consult Note (Signed)
Consultation Note Date: 11/29/2016   Patient Name: [name]  DOB: 04/15/1934  MRN: [id number]  Age / Sex: 82 y.o., female  PCP: Patient, No Pcp Per Referring Physician: Thurnell Lose, MD  Reason for Consultation: Establishing goals of care and Psychosocial/spiritual support  HPI/Patient Profile: 82 y.o. female  admitted on 11/26/2016 with past medical   history of hypertension and chronic kidney disease and anemia who has not been to a doctor for many years presents to the ER with complaints of worsening abdominal pain.  Patient states she has been doing abdominal pain for the last 2-3 months which has acutely worsened last 1 week.  Patient also has been having nausea vomiting.  Pain is mostly in the mid abdomen radiating to the back and chest.  Denies any blood in the vomitus.  Has had bowel movement last one today.  Patient also noticed jaundice.  Denies fever chills.  ED Course: In the ER patient's labs reveal lipase of 213 AST 553 ALT 429 total bilirubin was 10.3 with creatinine of 2.4 bicarb of 14.  Patient had jaundice with abdominal pain so CT of the abdomen and pelvis was done which showed gallbladder fossa mass with severe obstruction of the bile ducts and gallbladder.  There also was multiple densities in the liver concerning for metastatic disease.  Patient and her family face treatment option decisions, advanced directive decisions, and anticipatory care needs.   Clinical Assessment and Goals of Care:   This NP Wadie Lessen reviewed medical records, received report from team, assessed the patient and then meet at the patient's bedside  to discuss diagnosis, prognosis, GOC, and options.    I later spoke with both of her daughters at the bedside regarding the same.  A detailed discussion was had today regarding advanced directives.  Concepts specific to code status, artifical feeding and  hydration, continued IV antibiotics and rehospitalization was had.  The difference between a aggressive medical intervention path  and a palliative comfort care path for this patient at this time was had.  Values and goals of care important to patient and family were attempted to be elicited.   Concept of Hospice and Palliative Care were discussed  Natural trajectory and expectations at EOL were discussed.  Questions and concerns addressed.   Family encouraged to call with questions or concerns.  PMT will continue to support holistically.   HCPOA/ son [name]    SUMMARY OF RECOMMENDATIONS    Code Status/Advance Care Planning:  DNR   Palliative Prophylaxis:   Aspiration, Bowel Regimen, Delirium Protocol, Frequent Pain Assessment and Oral Care  Additional Recommendations (Limitations, Scope, Preferences):  Full Scope Treatment  Psycho-social/Spiritual:   Desire for further Chaplaincy support:yes  Additional Recommendations: Education on Hospice  Prognosis: Likely less than 6 months, decision for life prolonging measures will alter prognosis.  Unable to determine  Discharge Planning: Her daughters tell me that regardless of medical decisions plan will be to discharge patient home with 24-hour nursing care.  They request a list  of home care agencies and I will ask case management to get that them.   To Be Determined      Primary Diagnoses: Present on Admission: . Jaundice . Abdominal mass . Essential hypertension . Anemia in chronic kidney disease . Biliary obstruction due to malignant neoplasm ([place]) . Hepatic metastases ([place])   I have reviewed the medical record, interviewed the patient and family, and examined the patient. The following aspects are pertinent.  Past Medical History:  Diagnosis Date  . Chronic kidney disease   . Hypertension    Social History   Social History  . Marital status: Married    Spouse name: N/A  . Number of children: N/A    . Years of education: N/A   Social History Main Topics  . Smoking status: Never Smoker  . Smokeless tobacco: Never Used  . Alcohol use No  . Drug use: No  . Sexual activity: No   Other Topics Concern  . None   Social History Narrative  . None   Family History  Problem Relation Age of Onset  . Rheumatic fever Mother   . Diabetes Mellitus II Father   . Ovarian cancer Sister    Scheduled Meds: . heparin subcutaneous  5,000 Units Subcutaneous Q8H  . sodium polystyrene  15 g Oral Once   Continuous Infusions: PRN Meds:.fentaNYL (SUBLIMAZE) injection, hydrALAZINE, ondansetron **OR** ondansetron (ZOFRAN) IV Medications Prior to Admission:  Prior to Admission medications   Medication Sig Start Date End Date Taking? Authorizing Provider  acetaminophen (TYLENOL) 500 MG tablet Take 500 mg by mouth 2 (two) times daily.   Yes [provider]  Calcium-Magnesium-Vitamin D (CALCIUM 1200+D3 PO) Take 2 tablets by mouth daily.   Yes [provider]  co-enzyme Q-10 30 MG capsule Take 30 mg by mouth daily.   Yes [provider]  Garlic 397 MG CAPS Take 2 capsules by mouth daily.   Yes [provider]  glucosamine-chondroitin 500-400 MG tablet Take 2 tablets by mouth daily.   Yes [provider]  LECITHIN PO Take 1 capsule by mouth daily.   Yes [provider]  Multiple Vitamin (MULTIVITAMIN WITH MINERALS) TABS tablet Take 1 tablet by mouth daily.   Yes [provider]  thiamine (VITAMIN B-1) 100 MG tablet Take 100 mg by mouth daily.   Yes [provider]  vitamin E 200 UNIT capsule Take 200 Units by mouth daily.   Yes [provider]   Allergies  Allergen Reactions  . Airborne [Multiple Vitamins-Minerals]   . Amlodipine Besylate     REACTION: depression, disorientation, parasthesias, headache, dizziness, rash on leg:  All after one dose.  Marland Kitchen Amoxicillin   . Bactrim [Sulfamethoxazole-Trimethoprim]   . Benicar  [Olmesartan]   . Benzonatate   . Coreg [Carvedilol]   . Diltiazem Hcl     REACTION: Sleepy, headache  . Endal Cd [Phenyleph-Chlorphen-Codeine]   . Fexofenadine   . Furosemide     REACTION: Leg pain, jerlky sensation  . Ibuprofen   . Lisinopril     REACTION: Very intolerant.  Was stopped per Dr. Hassell Done.  . Loratadine   . Losartan Potassium     REACTION: [place]  . Monurol [Fosfomycin Tromethamine]   . Omeprazole   . Orudis [Ketoprofen]   . Penicillins     Has patient had a PCN reaction causing immediate rash, facial/tongue/throat swelling, SOB or lightheadedness with hypotension: Unknown Has patient had a PCN reaction causing severe rash involving mucus membranes or  skin necrosis: Unknown Has patient had a PCN reaction that required hospitalization: Unknown Has patient had a PCN reaction occurring within the last 10 years: Unknown If all of the above answers are "NO", then may proceed with Cephalosporin use.   . Ranitidine Hcl    Review of Systems  Constitutional: Positive for fatigue.  Neurological: Positive for weakness.    Physical Exam  Constitutional: She appears cachectic. She appears ill.  Cardiovascular: Normal rate, regular rhythm and normal heart sounds.   Pulmonary/Chest: She has decreased breath sounds in the right lower field and the left lower field.  Abdominal: There is tenderness.  Skin: Skin is warm and dry.    Vital Signs: BP (!) 155/84 (BP Location: Left Arm)   Pulse 67   Temp 97.9 F (36.6 C) (Oral)   Resp 18   Ht 4\' 10"  (1.473 m)   Wt 49.5 kg (109 lb 2 oz)   SpO2 98%   BMI 22.81 kg/m  Pain Assessment: No/denies pain   Pain Score: 0-No pain   SpO2: SpO2: 98 % O2 Device:SpO2: 98 % O2 Flow Rate: .O2 Flow Rate (L/min): 8 L/min  IO: Intake/output summary:  Intake/Output Summary (Last 24 hours) at 11/29/16 1246 Last data filed at 11/29/16 0902  Gross per 24 hour  Intake              810 ml  Output                0 ml  Net              810  ml    LBM: Last BM Date: 11/27/16 Baseline Weight: Weight: 48.1 kg (106 lb) Most recent weight: Weight: 49.5 kg (109 lb 2 oz)     Palliative Assessment/Data: 40%   Discussed with Attending services  Time In: 0930 Time Out: 1045 Time Total: 75 min Greater than 50%  of this time was spent counseling and coordinating care related to the above assessment and plan.  Signed by: Wadie Lessen, NP   Please contact Palliative Medicine Team phone at [phone] for questions and concerns.  For individual provider: See Shea Evans

## 2016-11-29 NOTE — Progress Notes (Signed)
Assessment Principal Problem:   Biliary obstruction due to malignant neoplasm (HCC)-ERCP and stent placed yesterday; appreciate input from Dr. Marin Olp; agree with recommendation of an IR guided percutaneous biopsy of the mass or one of the liver mets   Hepatic metastases ([place])   Plan:  Recommend IR consult for percutaneous biopsy of large mass or liver met.  Will be available if needed.   LOS: 3 days     1 Day Post-Op  Chief Complaint/Subjective: Having a little RUQ pain.  Tolerating clear liquids.  Objective: Vital signs in last 24 hours: Temp:  [97.7 F (36.5 C)-98.2 F (36.8 C)] 97.9 F (36.6 C) (10/30 0532) Pulse Rate:  [66-93] 67 (10/30 0532) Resp:  [16-21] 18 (10/30 0532) BP: (134-187)/(62-84) 155/84 (10/30 0532) SpO2:  [94 %-100 %] 98 % (10/30 0532) Last BM Date: 11/27/16  Intake/Output from previous day: 10/29 0701 - 10/30 0700 In: 750 [I.V.:750] Out: -  Intake/Output this shift: No intake/output data recorded.  PE: General- In NAD.  Awake and alert. Jaundiced. Abdomen-soft, mild RUQ tenderness  Lab Results:   Recent Labs  11/28/16 0408 11/29/16 0356  WBC 11.5* 14.9*  HGB 8.8* 9.1*  HCT 26.5* 27.6*  PLT 329 371   BMET  Recent Labs  11/28/16 0408 11/29/16 0356  NA 137 137  K 4.4 5.2*  CL 113* 113*  CO2 14* 15*  GLUCOSE 115* 126*  BUN 40* 45*  CREATININE 1.79* 1.92*  CALCIUM 9.0 9.0   PT/INR  Recent Labs  11/26/16 1056 11/27/16 0932  LABPROT 15.1 15.0  INR 1.20 1.19   Comprehensive Metabolic Panel:    Component Value Date/Time   NA 137 11/29/2016 0356   NA 137 11/28/2016 0408   K 5.2 (H) 11/29/2016 0356   K 4.4 11/28/2016 0408   CL 113 (H) 11/29/2016 0356   CL 113 (H) 11/28/2016 0408   CO2 15 (L) 11/29/2016 0356   CO2 14 (L) 11/28/2016 0408   BUN 45 (H) 11/29/2016 0356   BUN 40 (H) 11/28/2016 0408   CREATININE 1.92 (H) 11/29/2016 0356   CREATININE 1.79 (H) 11/28/2016 0408   GLUCOSE 126 (H) 11/29/2016 0356   GLUCOSE 115 (H)  11/28/2016 0408   CALCIUM 9.0 11/29/2016 0356   CALCIUM 9.0 11/28/2016 0408   CALCIUM 9.3 11/29/2006 1822   CALCIUM (LL) 07/14/2006 1600    5.5 CRITICAL RESULT CALLED TO, READ BACK BY AND VERIFIED WITH: P SUMMERELL,RN 2215 07/17/06 WBOND (NOTE) Result repeated and verified.  Amended report. CRITICAL CALCIUM Alba Cory RN ON [id number] AT 1413 BY EBANS   AST 574 (H) 11/29/2016 0356   AST 413 (H) 11/28/2016 0408   ALT 332 (H) 11/29/2016 0356   ALT 292 (H) 11/28/2016 0408   ALKPHOS 767 (H) 11/29/2016 0356   ALKPHOS 758 (H) 11/28/2016 0408   BILITOT 7.9 (H) 11/29/2016 0356   BILITOT 11.4 (H) 11/28/2016 0408   PROT 5.8 (L) 11/29/2016 0356   PROT 5.8 (L) 11/28/2016 0408   ALBUMIN 2.4 (L) 11/29/2016 0356   ALBUMIN 2.6 (L) 11/28/2016 0408     Studies/Results: Mr Abdomen Mrcp Wo Contrast  Result Date: 11/28/2016 CLINICAL DATA:  82 year old female inpatient with jaundice, biliary obstruction and multiple hepatic masses on noncontrast CT. EXAM: MRI ABDOMEN WITHOUT CONTRAST  (INCLUDING MRCP) TECHNIQUE: Multiplanar multisequence MR imaging of the abdomen was performed. Heavily T2-weighted images of the biliary and pancreatic ducts were obtained, and three-dimensional MRCP images were rendered by post processing. COMPARISON:  11/26/2016 unenhanced CT abdomen/pelvis. FINDINGS:  Lower chest: Mild hypoventilatory changes in the dependent lung bases. Hepatobiliary: The liver is enlarged by numerous (greater than 10) mildly T2 hyperintense similar-appearing masses scattered throughout the liver, with mild internal heterogeneity. Representative liver masses include a 4.8 x 3.4 cm segment 8 right liver lobe mass (series 8/ image 12) and a 7.0 x 4.5 cm segment 6 right liver lobe mass (series 8/ image 23). No hepatic steatosis. There is a dominant 8.3 x 5.2 cm infiltrative mass centered at the gallbladder neck (series 8/ image 30), which encases and narrows the gallbladder neck and which encases and occludes the  biliary confluence of the right and left hepatic ducts and common hepatic duct. Marked diffuse intrahepatic biliary ductal dilatation. Gallbladder is prominently distended with layering gallstones measuring up to 2.7 cm in size. No definite gallbladder wall thickening or significant pericholecystic fluid. The distal half of the common bile duct is normal caliber (4 mm diameter). Pancreas: There are several small pancreatic cystic lesions scattered throughout the pancreas, largest 1.5 x 0.7 x 1.3 cm in the inferior distal pancreatic body (series 8/ image 25), most of which demonstrate lobulated outer contours and a few of which demonstrate thin internal septations, with no apparent solid components. No significant main pancreatic duct dilation. No pancreas divisum. Spleen: Normal size. No mass. Adrenals/Urinary Tract: No discrete adrenal nodules. No hydronephrosis. Several simple appearing T2 hyperintense renal cysts in both kidneys, largest 1.8 cm in the interpolar right kidney. No overtly suspicious renal masses. Symmetric moderate to severe bilateral renal parenchymal atrophy. Stomach/Bowel: Moderate hiatal hernia. Otherwise normal nondistended stomach. Visualized small and large bowel is normal caliber, with no bowel wall thickening. Vascular/Lymphatic: Nonaneurysmal abdominal aorta. Preserved flow voids in the portal veins, noting extrinsic narrowing of the main portal vein by the dominant mass (series 8/ image 25). No pathologically enlarged lymph nodes in the abdomen. Other: No abdominal ascites or focal fluid collection. Musculoskeletal: No aggressive appearing focal osseous lesions. IMPRESSION: 1. Dominant 8.3 cm infiltrative mass centered at the gallbladder neck, which encases and occludes the biliary confluence in the porta hepatis with marked diffuse intrahepatic biliary ductal dilatation. Differential includes cholangiocarcinoma or primary gallbladder carcinoma. 2. Numerous similar-appearing mildly T2  hyperintense masses scattered throughout the liver, compatible with hepatic metastatic disease. 3. Extrinsic narrowing by the mass of the main portal vein, which appears remain patent . 4. Cholelithiasis. Marked gallbladder distention by the dominant mass. 5. Numerous small pancreatic cystic lesions, largest 1.5 cm in the distal pancreatic body, without overt high risk features on this noncontrast MRI study. 6. Moderate hiatal hernia. 7. Advanced symmetric renal atrophy. Electronically Signed   By: Ilona Sorrel M.D.   On: 11/28/2016 08:38   Mr 3d Recon At Scanner  Result Date: 11/28/2016 CLINICAL DATA:  82 year old female inpatient with jaundice, biliary obstruction and multiple hepatic masses on noncontrast CT. EXAM: MRI ABDOMEN WITHOUT CONTRAST  (INCLUDING MRCP) TECHNIQUE: Multiplanar multisequence MR imaging of the abdomen was performed. Heavily T2-weighted images of the biliary and pancreatic ducts were obtained, and three-dimensional MRCP images were rendered by post processing. COMPARISON:  11/26/2016 unenhanced CT abdomen/pelvis. FINDINGS: Lower chest: Mild hypoventilatory changes in the dependent lung bases. Hepatobiliary: The liver is enlarged by numerous (greater than 10) mildly T2 hyperintense similar-appearing masses scattered throughout the liver, with mild internal heterogeneity. Representative liver masses include a 4.8 x 3.4 cm segment 8 right liver lobe mass (series 8/ image 12) and a 7.0 x 4.5 cm segment 6 right liver lobe mass (series 8/ image  23). No hepatic steatosis. There is a dominant 8.3 x 5.2 cm infiltrative mass centered at the gallbladder neck (series 8/ image 30), which encases and narrows the gallbladder neck and which encases and occludes the biliary confluence of the right and left hepatic ducts and common hepatic duct. Marked diffuse intrahepatic biliary ductal dilatation. Gallbladder is prominently distended with layering gallstones measuring up to 2.7 cm in size. No definite  gallbladder wall thickening or significant pericholecystic fluid. The distal half of the common bile duct is normal caliber (4 mm diameter). Pancreas: There are several small pancreatic cystic lesions scattered throughout the pancreas, largest 1.5 x 0.7 x 1.3 cm in the inferior distal pancreatic body (series 8/ image 25), most of which demonstrate lobulated outer contours and a few of which demonstrate thin internal septations, with no apparent solid components. No significant main pancreatic duct dilation. No pancreas divisum. Spleen: Normal size. No mass. Adrenals/Urinary Tract: No discrete adrenal nodules. No hydronephrosis. Several simple appearing T2 hyperintense renal cysts in both kidneys, largest 1.8 cm in the interpolar right kidney. No overtly suspicious renal masses. Symmetric moderate to severe bilateral renal parenchymal atrophy. Stomach/Bowel: Moderate hiatal hernia. Otherwise normal nondistended stomach. Visualized small and large bowel is normal caliber, with no bowel wall thickening. Vascular/Lymphatic: Nonaneurysmal abdominal aorta. Preserved flow voids in the portal veins, noting extrinsic narrowing of the main portal vein by the dominant mass (series 8/ image 25). No pathologically enlarged lymph nodes in the abdomen. Other: No abdominal ascites or focal fluid collection. Musculoskeletal: No aggressive appearing focal osseous lesions. IMPRESSION: 1. Dominant 8.3 cm infiltrative mass centered at the gallbladder neck, which encases and occludes the biliary confluence in the porta hepatis with marked diffuse intrahepatic biliary ductal dilatation. Differential includes cholangiocarcinoma or primary gallbladder carcinoma. 2. Numerous similar-appearing mildly T2 hyperintense masses scattered throughout the liver, compatible with hepatic metastatic disease. 3. Extrinsic narrowing by the mass of the main portal vein, which appears remain patent . 4. Cholelithiasis. Marked gallbladder distention by the  dominant mass. 5. Numerous small pancreatic cystic lesions, largest 1.5 cm in the distal pancreatic body, without overt high risk features on this noncontrast MRI study. 6. Moderate hiatal hernia. 7. Advanced symmetric renal atrophy. Electronically Signed   By: Ilona Sorrel M.D.   On: 11/28/2016 08:38   Dg Ercp Biliary & Pancreatic Ducts  Result Date: 11/28/2016 CLINICAL DATA:  Biliary obstruction EXAM: ERCP TECHNIQUE: Multiple spot images obtained with the fluoroscopic device and submitted for interpretation post-procedure. FLUOROSCOPY TIME:  Fluoroscopy Time:  20 minutes and 46 seconds Radiation Exposure Index (if provided by the fluoroscopic device): Not given Number of Acquired Spot Images: 20 COMPARISON:  None. FINDINGS: Contrast fills the biliary tree. Balloon stone retrieval is documented. A stent has been placed across the mid biliary tree and porta hepatis. IMPRESSION: See above. These images were submitted for radiologic interpretation only. Please see the procedural report for the amount of contrast and the fluoroscopy time utilized. Electronically Signed   By: Marybelle Killings M.D.   On: 11/28/2016 13:47    Anti-infectives: Anti-infectives    Start     Dose/Rate Route Frequency Ordered Stop   11/28/16 1215  ciprofloxacin (CIPRO) IVPB 400 mg     400 mg 200 mL/hr over 60 Minutes Intravenous  Once 11/28/16 1207 11/28/16 1253       Eshan Trupiano J 11/29/2016

## 2016-11-29 NOTE — Consult Note (Signed)
Referral MD  Reason for Referral: Biliary obstruction secondary to hepatic mass; hepatic metastases   Chief Complaint  Patient presents with  . Abdominal Pain  : I was having pain in my right side.  HPI: [name] is a very charming 82 year old white female. She actually has been in fairly decent health. She has been living by herself.  She began to have some issues over the summer. She is having some intermittent abdominal pain. The pain seemed to be in the left upper quadrant than migrated over to the right side.  The pain worsened. She lost her appetite. She says she lost about 5 pounds in the past month. She did have some nausea. His heart status is being any vomiting. She's had no diarrhea. Her urine has turned dark.  She was admitted on October 27. At the time of admission, her bilirubin was 10.3. Her alkaline phosphatase Ace was 800. SGPT 4:30 and SGOT 550. Albumin was 3.2. Her white cell count was 12. Hemoglobin 10.5.  She had a CT scan done. This showed a large mass at the hepatic hilum/gallbladder fossa. There was severe bile duct and gallbladder obstruction. Showed multiple low-density masses throughout the liver. No adenopathy was noted in the abdomen. Lung bases looked okay.  She was seen by surgery. She was seen by gastroenterology. She had an ERCP performed.  She did have tumor markers done. A CA 19-9 was done. This was markedly elevated at 7700.  She feels a little bit better. Her bilirubin has come down a little bit. Today, her bilirubin is 7.9. Her alkaline phosphatase was 767. SGPT 332 and SGOT 570. Albumin is 2.4. Her creatinine is 1.92.  I take care of her husband about 5 years ago. He had leukemia.  She is very nice. We had a very long talk. I told her that we are looking at stage IV disease. I told her that stage IV disease is any kind of cancer that has spread to other parts of the body and can be treated but not cured. She initially said no to chemotherapy. She  just thought that with chemotherapy she would lose her hair, get sick, and an be miserable. I told her that chemotherapy for what I suspect is cholangiocarcinoma can be tolerated. I would only use single agent therapy for her. I would only use gemcitabine. I told her that gemcitabine could be tolerated. She would not lose her hair. She is going to think about this.  I told her that if she decided not to try treatment, which certainly is reasonable, that I would be surprised if she made it to the end of this year. The prealbumin which I'm sending off today will help.  I do think that we need a biopsy. She is not a big lady. I would think that a biopsy should be easy to obtain.  She has never smoked. She has never drank.  I would say that her performance status is probably ECOG 2-3 at best.     Past Medical History:  Diagnosis Date  . Chronic kidney disease   . Hypertension   :  Past Surgical History:  Procedure Laterality Date  . ABDOMINAL HYSTERECTOMY    . APPENDECTOMY    . HERNIA REPAIR    . prolapsed bladder surgery    . TONSILLECTOMY    :   Current Facility-Administered Medications:  .  Chlorhexidine Gluconate Cloth 2 % PADS 6 each, 6 each, Topical, Daily, Thurnell Lose, MD, 6 each  at 11/28/16 1000 .  dextrose 5 %-0.9 % sodium chloride infusion, , Intravenous, Continuous, Thurnell Lose, MD, Last Rate: 50 mL/hr at 11/28/16 1512 .  fentaNYL (SUBLIMAZE) injection 25 mcg, 25 mcg, Intravenous, Q4H PRN, Rise Patience, MD, 25 mcg at 11/27/16 1939 .  heparin injection 5,000 Units, 5,000 Units, Subcutaneous, Q8H, Thurnell Lose, MD, 5,000 Units at 11/29/16 0711 .  hydrALAZINE (APRESOLINE) injection 10 mg, 10 mg, Intravenous, Q6H PRN, Thurnell Lose, MD, 10 mg at 11/28/16 1848 .  mupirocin ointment (BACTROBAN) 2 % 1 application, 1 application, Nasal, BID, Thurnell Lose, MD, 1 application at 16/10/96 2121 .  ondansetron (ZOFRAN) tablet 4 mg, 4 mg, Oral, Q6H PRN  **OR** ondansetron (ZOFRAN) injection 4 mg, 4 mg, Intravenous, Q6H PRN, Rise Patience, MD, 4 mg at 11/26/16 2218:  . Chlorhexidine Gluconate Cloth  6 each Topical Daily  . heparin subcutaneous  5,000 Units Subcutaneous Q8H  . mupirocin ointment  1 application Nasal BID  :  Allergies  Allergen Reactions  . Airborne [Multiple Vitamins-Minerals]   . Amlodipine Besylate     REACTION: depression, disorientation, parasthesias, headache, dizziness, rash on leg:  All after one dose.  Marland Kitchen Amoxicillin   . Bactrim [Sulfamethoxazole-Trimethoprim]   . Benicar [Olmesartan]   . Benzonatate   . Coreg [Carvedilol]   . Diltiazem Hcl     REACTION: Sleepy, headache  . Endal Cd [Phenyleph-Chlorphen-Codeine]   . Fexofenadine   . Furosemide     REACTION: Leg pain, jerlky sensation  . Ibuprofen   . Lisinopril     REACTION: Very intolerant.  Was stopped per Dr. Hassell Done.  . Loratadine   . Losartan Potassium     REACTION: Itiching  . Monurol [Fosfomycin Tromethamine]   . Omeprazole   . Orudis [Ketoprofen]   . Penicillins     Has patient had a PCN reaction causing immediate rash, facial/tongue/throat swelling, SOB or lightheadedness with hypotension: Unknown Has patient had a PCN reaction causing severe rash involving mucus membranes or skin necrosis: Unknown Has patient had a PCN reaction that required hospitalization: Unknown Has patient had a PCN reaction occurring within the last 10 years: Unknown If all of the above answers are "NO", then may proceed with Cephalosporin use.   . Ranitidine Hcl   :  Family History  Problem Relation Age of Onset  . Rheumatic fever Mother   . Diabetes Mellitus II Father   . Ovarian cancer Sister   :  Social History   Social History  . Marital status: Married    Spouse name: N/A  . Number of children: N/A  . Years of education: N/A   Occupational History  . Not on file.   Social History Main Topics  . Smoking status: Never Smoker  . Smokeless  tobacco: Never Used  . Alcohol use No  . Drug use: No  . Sexual activity: No   Other Topics Concern  . Not on file   Social History Narrative  . No narrative on file  :  Pertinent items are noted in HPI.  Exam: Patient Vitals for the past 24 hrs:  BP Temp Temp src Pulse Resp SpO2  11/29/16 0532 (!) 155/84 97.9 F (36.6 C) Oral 67 18 98 %  11/29/16 0215 134/69 - - - - 96 %  11/28/16 2051 (!) 170/81 98 F (36.7 C) Oral 72 18 94 %  11/28/16 1743 (!) 167/62 - - - - -  11/28/16 1641 (!) 178/74 - -  66 - -  11/28/16 1443 (!) 182/83 - - - - -  11/28/16 1439 (!) 185/84 97.7 F (36.5 C) Oral 76 20 96 %  11/28/16 1415 (!) 187/72 - - 72 18 95 %  11/28/16 1410 - - - 72 (!) 21 100 %  11/28/16 1405 - - - 69 16 99 %  11/28/16 1400 (!) 178/63 - - 71 18 99 %  11/28/16 1355 - - - 72 16 99 %  11/28/16 1350 (!) 174/70 - - 84 17 99 %  11/28/16 1345 - - - 85 18 97 %  11/28/16 1342 (!) 159/78 98 F (36.7 C) Oral 89 20 96 %  11/28/16 1055 (!) 150/66 98.2 F (36.8 C) Oral 93 20 96 %  11/28/16 0921 - - - - - 96 %    as above    Recent Labs  11/28/16 0408 11/29/16 0356  WBC 11.5* 14.9*  HGB 8.8* 9.1*  HCT 26.5* 27.6*  PLT 329 371    Recent Labs  11/28/16 0408 11/29/16 0356  NA 137 137  K 4.4 5.2*  CL 113* 113*  CO2 14* 15*  GLUCOSE 115* 126*  BUN 40* 45*  CREATININE 1.79* 1.92*  CALCIUM 9.0 9.0    Blood smear review:  None  Pathology: Pending     Assessment and Plan:  Ms. [name] is a very charming 82 year old white female. 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 She has a markedly elevated CA 19-9.  I do think that a biopsy would be reasonable. At least we can get the biopsy and send off for genetic markers, including PD-L1 and TMB. If she is positive for these, then we might be able to actually use immunotherapy for treatment which might have a better tolerability.  Her prealbumin will be very helpful. We will have to see what this is.  I will speak to  her daughter by phone and let her know that I saw [name].  [name] is very nice. She wanted me to pray with her. I definitely jumped at the opportunity to pray with her. She had her Bible right next to her. She obviously has a lot of faith.  We will follow along.  It is likely that we will probably end up having hospice involved. I think the biopsy and are prealbumin will be essential and decide whether or not any chemotherapy is going to make a difference.  I spent about an hour with her this morning. I answered all of her questions. Mostly, we had good fellowship. She certainly understands what is going on and is not afraid to pass on to [place].  Lattie Haw, MD  Darlyn Chamber 33:6

## 2016-11-30 ENCOUNTER — Encounter (HOSPITAL_COMMUNITY): Payer: Self-pay | Admitting: Hematology & Oncology

## 2016-11-30 DIAGNOSIS — Z7189 Other specified counseling: Secondary | ICD-10-CM

## 2016-11-30 DIAGNOSIS — C787 Secondary malignant neoplasm of liver and intrahepatic bile duct: Secondary | ICD-10-CM

## 2016-11-30 DIAGNOSIS — I10 Essential (primary) hypertension: Secondary | ICD-10-CM

## 2016-11-30 DIAGNOSIS — Z66 Do not resuscitate: Secondary | ICD-10-CM

## 2016-11-30 DIAGNOSIS — Z515 Encounter for palliative care: Secondary | ICD-10-CM

## 2016-11-30 HISTORY — DX: Other specified counseling: Z71.89

## 2016-11-30 LAB — COMPREHENSIVE METABOLIC PANEL
ALBUMIN: 2.7 g/dL — AB (ref 3.5–5.0)
ALK PHOS: 743 U/L — AB (ref 38–126)
ALT: 313 U/L — AB (ref 14–54)
ANION GAP: 14 (ref 5–15)
AST: 410 U/L — ABNORMAL HIGH (ref 15–41)
BUN: 57 mg/dL — ABNORMAL HIGH (ref 6–20)
CALCIUM: 9.2 mg/dL (ref 8.9–10.3)
CHLORIDE: 112 mmol/L — AB (ref 101–111)
CO2: 13 mmol/L — AB (ref 22–32)
CREATININE: 2.43 mg/dL — AB (ref 0.44–1.00)
GFR calc non Af Amer: 18 mL/min — ABNORMAL LOW (ref >60)
GFR, EST AFRICAN AMERICAN: 21 mL/min — AB (ref >60)
GLUCOSE: 130 mg/dL — AB (ref 65–99)
Potassium: 4.3 mmol/L (ref 3.5–5.1)
SODIUM: 139 mmol/L (ref 135–145)
Total Bilirubin: 8.5 mg/dL — ABNORMAL HIGH (ref 0.3–1.2)
Total Protein: 6.7 g/dL (ref 6.5–8.1)

## 2016-11-30 LAB — CBC
HCT: 29.9 % — ABNORMAL LOW (ref 36.0–46.0)
HEMOGLOBIN: 10.1 g/dL — AB (ref 12.0–15.0)
MCH: 29.4 pg (ref 26.0–34.0)
MCHC: 33.8 g/dL (ref 30.0–36.0)
MCV: 86.9 fL (ref 78.0–100.0)
PLATELETS: 237 10*3/uL (ref 150–400)
RBC: 3.44 MIL/uL — AB (ref 3.87–5.11)
RDW: 17.6 % — ABNORMAL HIGH (ref 11.5–15.5)
WBC: 21.3 10*3/uL — ABNORMAL HIGH (ref 4.0–10.5)

## 2016-11-30 LAB — AMMONIA: Ammonia: 90 umol/L — ABNORMAL HIGH (ref 9–35)

## 2016-11-30 MED ORDER — HYDROXYZINE HCL 10 MG PO TABS
10.0000 mg | ORAL_TABLET | Freq: Three times a day (TID) | ORAL | Status: DC | PRN
  Administered 2016-11-30: 10 mg via ORAL
  Filled 2016-11-30: qty 1

## 2016-11-30 MED ORDER — OXYCODONE HCL 20 MG/ML PO CONC
10.0000 mg | ORAL | Status: DC | PRN
  Administered 2016-11-30 – 2016-12-02 (×4): 10 mg via ORAL
  Filled 2016-11-30 (×5): qty 1

## 2016-11-30 MED ORDER — CIPROFLOXACIN IN D5W 400 MG/200ML IV SOLN: 400.0000 mg | Freq: Two times a day (BID) | INTRAVENOUS | Status: DC

## 2016-11-30 MED ORDER — METHYLPREDNISOLONE SODIUM SUCC 125 MG IJ SOLR
80.0000 mg | Freq: Once | INTRAMUSCULAR | Status: AC
  Administered 2016-11-30: 80 mg via INTRAVENOUS
  Filled 2016-11-30: qty 2

## 2016-11-30 MED ORDER — FAMOTIDINE IN NACL 20-0.9 MG/50ML-% IV SOLN
20.0000 mg | Freq: Once | INTRAVENOUS | Status: AC
  Administered 2016-11-30: 20 mg via INTRAVENOUS
  Filled 2016-11-30: qty 50

## 2016-11-30 MED ORDER — CIPROFLOXACIN IN D5W 400 MG/200ML IV SOLN
400.0000 mg | INTRAVENOUS | Status: DC
  Administered 2016-11-30: 400 mg via INTRAVENOUS
  Filled 2016-11-30 (×2): qty 200

## 2016-11-30 MED ORDER — DRONABINOL 2.5 MG PO CAPS
2.5000 mg | ORAL_CAPSULE | Freq: Every day | ORAL | Status: DC
  Administered 2016-11-30 – 2016-12-02 (×2): 2.5 mg via ORAL
  Filled 2016-11-30 (×2): qty 1

## 2016-11-30 MED ORDER — SODIUM CHLORIDE 0.9 % IV SOLN
INTRAVENOUS | Status: DC
  Administered 2016-11-30 – 2016-12-01 (×2): via INTRAVENOUS

## 2016-11-30 MED ORDER — SODIUM CHLORIDE 0.9 % IV SOLN
510.0000 mg | Freq: Once | INTRAVENOUS | Status: AC
  Administered 2016-11-30: 510 mg via INTRAVENOUS
  Filled 2016-11-30: qty 17

## 2016-11-30 MED ORDER — OXYCODONE HCL 20 MG/ML PO CONC
10.0000 mg | ORAL | Status: DC | PRN
  Administered 2016-11-30: 10 mg via ORAL
  Filled 2016-11-30: qty 1

## 2016-11-30 NOTE — Progress Notes (Signed)
[name] had her liver biopsy yesterday. The results hopefully will be out tomorrow.  Her prealbumin is 13.8. This is low but not as well as I thought it would be.  Her bilirubin has not improved. Bilirubin is 8.5. Her liver function tests are elevated but slowly coming down. Her iron studies show iron deficiency. I will going give her a dose of IV iron.  She's having quite a bit of pain. She is getting very low-dose fentanyl. I will change this.  She is also having some reflux. I think he would help to get her on a PPI.  I just do not think that she is going to be strong enough to take any kind of chemotherapy. I think our only hope to help her out for treatment is going to be immunotherapy. I suppose she may have something other than adenocarcinoma. If this is lymphoma, then we could treat this. However, I think this would be highly unlikely.  I'm not talked to her about palliative care/hospice. I will do this in the next day or so. I have talked to her daughter. I think her daughter understands what is going on.  It will be nice if she could eat a little bit more. I might try her on some low-dose Marinol. I want to try to avoid Megace because of the thromboembolic risk.  There really is no change in her physical exam. Her blood pressure is up a little bit. Her heart rate is up a little bit.  As always, we had a very good prayer session. She still has a very strong faith.  I appreciate everybody's help on [address]. Everybody is doing a fantastic job!!!  Lattie Haw, MD  Darlyn Chamber 17:14

## 2016-11-30 NOTE — Progress Notes (Signed)
[facility]  PROGRESS NOTE  [name] XBD:[id number] DOB: May 07, 1934 DOA: 11/26/2016 PCP: Patient, No Pcp Per   Brief HPI:   82 y.o.femalewith history of hypertension and chronic kidney disease and anemia who has not been to a doctor for many years presents to the ER with complaints of worsening abdominal pain. Patient states she has been doing abdominal pain for the last 2-3 months which has acutely worsened last 1 week. Patient also has been having nausea vomiting. Pain is mostly in the mid abdomen radiating to the back and chest. Denies any blood in the vomitus. Has had bowel movement last one today. Patient also noticed jaundice. Denies fever chills.  Further workup suggests possible gallbladder malignancy.    Subjective   Patient denies any pain, no nausea or vomiting.   Assessment/Plan:     1. 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  Patient underwent MRCP which showed dominant 8.3 cm infiltrative mass centered at the gallbladder neck which encases and occludes the biliary confluence in the porta hepatis and marked diffuse intrahepatic biliary ductal dilatation.  Differential includes cholangiocarcinoma or primary gallbladder carcinoma.Underwent ERCP on 11/28/2016 again findings suggestive of gallbladder neck malignancy, 2 stents were deployed one in the CBD other in the pancreatic duct to relieve obstruction, she also has suspicious appearing spots in the liver noted on MRCP for which she is underwent CT-guided biopsy on 11/29/2016. 2. Hypertension-continue as needed hydralazine 3. CKD stage III-creatinine 2.43 today, IV fluids were discontinued.  Will restart IV normal saline at 100 mL/h.  Follow BMP in a.m. 4. Hyperkalemia-resolved.    DVT prophylaxis: Heparin  Code Status: DNR  Family Communication: No family present at bedside  Disposition Plan: Pending GI  evaluation   Consultants:  Gastroenterology  General surgery  Oncology  Palliative care  Procedures:  None  Continuous infusions . ciprofloxacin Stopped (11/30/16 1711)      Antibiotics:   Anti-infectives    Start     Dose/Rate Route Frequency Ordered Stop   11/30/16 1600  ciprofloxacin (CIPRO) IVPB 400 mg  Status:  Discontinued     400 mg 200 mL/hr over 60 Minutes Intravenous Every 12 hours 11/30/16 1419 11/30/16 1425   11/30/16 1600  ciprofloxacin (CIPRO) IVPB 400 mg     400 mg 200 mL/hr over 60 Minutes Intravenous Every 24 hours 11/30/16 1425     11/28/16 1215  ciprofloxacin (CIPRO) IVPB 400 mg     400 mg 200 mL/hr over 60 Minutes Intravenous  Once 11/28/16 1207 11/28/16 1253       Objective   Vitals:   11/29/16 1541 11/29/16 1622 11/29/16 2026 11/30/16 0401  BP: (!) 144/64 (!) 158/79 (!) 157/85 (!) 147/78  Pulse: 86 99 95 (!) 101  Resp: 18 18 18 20   Temp: 98 F (36.7 C) 97.8 F (36.6 C) 98.3 F (36.8 C) 98 F (36.7 C)  TempSrc: Oral Oral Oral Oral  SpO2: 98% 98% 98% 98%  Weight:      Height:        Intake/Output Summary (Last 24 hours) at 11/30/16 1830 Last data filed at 11/30/16 1515  Gross per 24 hour  Intake              840 ml  Output                0 ml  Net              840  ml   Filed Weights   11/26/16 1659 11/27/16 1300 11/28/16 0451  Weight: 50 kg (110 lb 3.7 oz) 50.9 kg (112 lb 3.4 oz) 49.5 kg (109 lb 2 oz)     Physical Examination:   Physical Exam: Eyes: No icterus, extraocular muscles intact  Mouth: Oral mucosa is moist, no lesions on palate,  Neck: Supple, no deformities, masses, or tenderness Lungs: Normal respiratory effort, bilateral clear to auscultation, no crackles or wheezes.  Heart: Regular rate and rhythm, S1 and S2 normal, no murmurs, rubs auscultated Abdomen: BS normoactive,soft,nondistended,non-tender to palpation,no organomegaly Extremities: No pretibial edema, no erythema, no cyanosis, no clubbing Neuro :  Alert and oriented to time, place and person, No focal deficits  Skin: No rashes seen on exam       Data Reviewed: I have personally reviewed following labs and imaging studies  CBG: No results for input(s): GLUCAP in the last 168 hours.  CBC:  Recent Labs Lab 11/26/16 1133 11/27/16 0412 11/28/16 0408 11/29/16 0356 11/30/16 0404  WBC 12.0* 11.3* 11.5* 14.9* 21.3*  NEUTROABS  --   --   --  14.2*  --   HGB 10.5* 9.6* 8.8* 9.1* 10.1*  HCT 30.6* 28.7* 26.5* 27.6* 29.9*  MCV 85.7 86.7 88.0 88.7 86.9  PLT 376 365 329 371 841    Basic Metabolic Panel:  Recent Labs Lab 11/26/16 1133 11/27/16 0412 11/28/16 0408 11/29/16 0356 11/30/16 0404  NA 132* 136 137 137 139  K 3.7 3.8 4.4 5.2* 4.3  CL 101 110 113* 113* 112*  CO2 14* 14* 14* 15* 13*  GLUCOSE 120* 123* 115* 126* 130*  BUN 65* 50* 40* 45* 57*  CREATININE 2.46* 1.88* 1.79* 1.92* 2.43*  CALCIUM 9.7 9.1 9.0 9.0 9.2  MG  --   --  1.8  --   --     No results found for this or any previous visit (from the past 240 hour(s)).   Liver Function Tests:  Recent Labs Lab 11/26/16 1133 11/27/16 0412 11/28/16 0408 11/29/16 0356 11/30/16 0404  AST 553* 472* 413* 574* 410*  ALT 429* 328* 292* 332* 313*  ALKPHOS 798* 780* 758* 767* 743*  BILITOT 10.3* 10.3* 11.4* 7.9* 8.5*  PROT 7.3 6.4* 5.8* 5.8* 6.7  ALBUMIN 3.2* 2.8* 2.6* 2.4* 2.7*    Recent Labs Lab 11/26/16 1133  LIPASE 213*    Recent Labs Lab 11/30/16 0404  AMMONIA 90*       Studies: Ir US Guide Bx Asp/drain  Result Date: 11/29/2016 INDICATION: Liver lesions EXAM: ULTRASOUND-GUIDED LIVER BIOPSY MEDICATIONS: None. ANESTHESIA/SEDATION: Moderate (conscious) sedation was employed during this procedure. A total of Versed 0.5 mg and Fentanyl 25 mcg was administered intravenously. Moderate Sedation Time: 10 minutes. The patient's level of consciousness and vital signs were monitored continuously by radiology nursing throughout the procedure under my  direct supervision. FLUOROSCOPY TIME:  Fluoroscopy Time:  minutes  seconds ( mGy). COMPLICATIONS: None immediate. PROCEDURE: Informed written consent was obtained from the patient after a thorough discussion of the procedural risks, benefits and alternatives. All questions were addressed. Maximal Sterile Barrier Technique was utilized including caps, mask, sterile gowns, sterile gloves, sterile drape, hand hygiene and skin antiseptic. A timeout was performed prior to the initiation of the procedure. The abdomen was prepped and draped in a sterile fashion. 1% lidocaine was utilized for local anesthesia. Under sonographic guidance, a 17 gauge needle was inserted into 8 lesion within the left lobe of the liver. Four 18 gauge core biopsies  were obtained. Gel-Foam slurry was injected into the tract with needle placement. FINDINGS: Images document needle placement in the left lobe liver lesion. Post biopsy images demonstrate no evidence of hemorrhage. IMPRESSION: Successful ultrasound-guided core biopsy of a lesion in the left lobe of the liver. Electronically Signed   By: Marybelle Killings M.D.   On: 11/29/2016 15:41    Scheduled Meds: . dronabinol  2.5 mg Oral QAC lunch  . heparin subcutaneous  5,000 Units Subcutaneous Q8H      Time spent: 20 min  [facility] Pager [phone]. If 7PM-7AM, please contact night-coverage at [email or web address], Office  [phone]  password [email or web address]  11/30/2016, 6:30 PM  LOS: 4 days

## 2016-11-30 NOTE — Progress Notes (Signed)
Shela Commons 2:12 PM  Subjective: Patient seen and examined and her case discussed with her daughter as well and her pain is actually better and she has no new complaints and she prefers her pain medicine every 3 hours and she had no problems from the biopsy and we rediscussed my stent as well  Objective: Vital signs stable afebrile no acute distress abdomen is softer and less tender than the last few days occasional bowel sounds bili a little worse other liver tests better white count increased BUN and creatinine are little worse  Assessment: Metastatic cancer bile duct obstruction  Plan: Will start antibiotics for possible cholangitis in an undrained segment and await pathology as well as tomorrow's labs and if needed this weekend reimage liver and see if other drains are needed. PTC or another try at ERCP depending on findings at x-ray and I'm available next week on Tuesday afternoon if an semielective ERCP is needed  [facility] E  Pager [phone] After 5PM or if no answer call [phone]

## 2016-12-01 ENCOUNTER — Ambulatory Visit: Payer: Self-pay | Admitting: Physician Assistant

## 2016-12-01 DIAGNOSIS — R1900 Intra-abdominal and pelvic swelling, mass and lump, unspecified site: Secondary | ICD-10-CM

## 2016-12-01 DIAGNOSIS — R531 Weakness: Secondary | ICD-10-CM

## 2016-12-01 LAB — CBC
HCT: 26.3 % — ABNORMAL LOW (ref 36.0–46.0)
Hemoglobin: 8.7 g/dL — ABNORMAL LOW (ref 12.0–15.0)
MCH: 29.6 pg (ref 26.0–34.0)
MCHC: 33.1 g/dL (ref 30.0–36.0)
MCV: 89.5 fL (ref 78.0–100.0)
PLATELETS: 344 10*3/uL (ref 150–400)
RBC: 2.94 MIL/uL — AB (ref 3.87–5.11)
RDW: 17.7 % — AB (ref 11.5–15.5)
WBC: 18.2 10*3/uL — AB (ref 4.0–10.5)

## 2016-12-01 LAB — COMPREHENSIVE METABOLIC PANEL
ALK PHOS: 523 U/L — AB (ref 38–126)
ALT: 211 U/L — AB (ref 14–54)
AST: 193 U/L — AB (ref 15–41)
Albumin: 2.5 g/dL — ABNORMAL LOW (ref 3.5–5.0)
Anion gap: 14 (ref 5–15)
BILIRUBIN TOTAL: 4.2 mg/dL — AB (ref 0.3–1.2)
BUN: 72 mg/dL — AB (ref 6–20)
CALCIUM: 8.9 mg/dL (ref 8.9–10.3)
CHLORIDE: 113 mmol/L — AB (ref 101–111)
CO2: 14 mmol/L — ABNORMAL LOW (ref 22–32)
CREATININE: 2.43 mg/dL — AB (ref 0.44–1.00)
GFR, EST AFRICAN AMERICAN: 21 mL/min — AB (ref >60)
GFR, EST NON AFRICAN AMERICAN: 18 mL/min — AB (ref >60)
Glucose, Bld: 144 mg/dL — ABNORMAL HIGH (ref 65–99)
Potassium: 4.1 mmol/L (ref 3.5–5.1)
Sodium: 141 mmol/L (ref 135–145)
TOTAL PROTEIN: 6 g/dL — AB (ref 6.5–8.1)

## 2016-12-01 NOTE — Progress Notes (Signed)
CM consult for home hospice referral. This CM met with pt and daughter at bedside for disposition planning Corena Herter [phone]). Choice offered and [facility] chosen. Referral called to [facility]. Request for hospital bed, OBT and 3in1. Daughter hiring personal caregivers to be with pt around the clock. Per MD, plan is to dc tomorrow 11/2. Marney Doctor RN,BSN,NCM [phone]

## 2016-12-01 NOTE — Progress Notes (Signed)
Loss of IV access. Pt's daughter declined having IV restarted unless it was absolutely necessary. Stated she didn't know why pt was on antibiotic anyway. RN explained that MD wanted pt to have IVF as well.  Daughter deferred restart until tomorrow after speaking to MD.

## 2016-12-01 NOTE — Progress Notes (Signed)
[name] is feeling a little bit better. Her pain is under adequate control. The OxyFast does seem to help her out.  We did get the pathology report back. This showed a poorly differentiated carcinoma. The pathologist are running additional studies to determine where it might be coming from.  I think that the pathology report is very helpful. With this undifferentiated type of cancer, I just don't think that chemotherapy is going to have much of an impact. As such, I don't think we need to treat [name]. I think the chance of response is less than 10% and there might be considerable side effects.  I talked [name] at length this morning. I told her about the diagnosis. She and I discussed this. She does not want to take treatment. I totally agree with this.  Our goal at this point is quality of life and comfort. She would like to go home. Hospice will clearly be helpful. We need to make a hospice referral. I sent a order for case management to arrange for this. Her daughter, [name], is the contact person.  In talking with [name] this morning, I don't think that [name]'s house is going to be ready for her for a couple days. As such, I don't think that this Rat[facility] should be discharged before the weekend.  This Ley's labs, thankfully, show that her bilirubin is improving. Her liver function tests are improving. As such, the stent that Dr. Watt Climes placed seems to be draining adequately.  She did get iron yesterday. Her hemoglobin is 8.7 today. We will have to check this tomorrow. I would actually consider transfusing her if her hemoglobin drops. I think this actually might help her quality of life.  Given the pathology reading port, I have a feeling that Ms. Kennedy probably will not make it through the month of November. I told her daughter this.  She is a DO NOT RESUSCITATE. I totally agree with this.  Again, our goal is quality of life and comfort.  I know that the staff  on [address]. are doing a fantastic job in taking care of [name].  Diamond Haw, MD  2 Timothy  4:16-18  AddendumLoleta Chance cell # is [phone]

## 2016-12-01 NOTE — Progress Notes (Signed)
[facility]  PROGRESS NOTE  [name] YQI:[id number] DOB: October 03, 1934 DOA: 11/26/2016 PCP: Patient, No Pcp Per   Brief HPI:   82 y.o.femalewith history of hypertension and chronic kidney disease and anemia who has not been to a doctor for many years presents to the ER with complaints of worsening abdominal pain. Patient states she has been doing abdominal pain for the last 2-3 months which has acutely worsened last 1 week. Patient also has been having nausea vomiting. Pain is mostly in the mid abdomen radiating to the back and chest. Denies any blood in the vomitus. Has had bowel movement last one today. Patient also noticed jaundice. Denies fever chills.  Further workup suggests possible gallbladder malignancy.    Subjective   Patient seen and examined, denies any complaints.  No pain.  No shortness of breath.   Assessment/Plan:     1. 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  Patient underwent MRCP which showed dominant 8.3 cm infiltrative mass centered at the gallbladder neck which encases and occludes the biliary confluence in the porta hepatis and marked diffuse intrahepatic biliary ductal dilatation.  Differential includes cholangiocarcinoma or primary gallbladder carcinoma.Underwent ERCP on 11/28/2016 again findings suggestive of gallbladder neck malignancy, 2 stents were deployed one in the CBD other in the pancreatic duct to relieve obstruction, she also has suspicious appearing spots in the liver noted on MRCP for which she is underwent CT-guided biopsy on 11/29/2016. 2. Hypertension-continue as needed hydralazine 3. CKD stage III-creatinine 2.43 today, IV fluids were discontinued.  Will restart IV normal saline at 100 mL/h.  Follow BMP in a.m. 4. Hyperkalemia-resolved.    DVT prophylaxis: Heparin  Code Status: DNR  Family Communication: No family present at  bedside  Disposition Plan: Likely home with hospice in a.m.   Consultants:  Gastroenterology  General surgery  Oncology  Palliative care  Procedures:  None  Continuous infusions . sodium chloride 100 mL/hr at 12/01/16 0511  . ciprofloxacin Stopped (11/30/16 1711)      Antibiotics:   Anti-infectives    Start     Dose/Rate Route Frequency Ordered Stop   11/30/16 1600  ciprofloxacin (CIPRO) IVPB 400 mg  Status:  Discontinued     400 mg 200 mL/hr over 60 Minutes Intravenous Every 12 hours 11/30/16 1419 11/30/16 1425   11/30/16 1600  ciprofloxacin (CIPRO) IVPB 400 mg     400 mg 200 mL/hr over 60 Minutes Intravenous Every 24 hours 11/30/16 1425     11/28/16 1215  ciprofloxacin (CIPRO) IVPB 400 mg     400 mg 200 mL/hr over 60 Minutes Intravenous  Once 11/28/16 1207 11/28/16 1253       Objective   Vitals:   11/30/16 0401 11/30/16 2105 12/01/16 0516 12/01/16 0750  BP: (!) 147/78 (!) 144/71 (!) 157/79   Pulse: (!) 101 88 74   Resp: 20 16 16    Temp: 98 F (36.7 C) 98.5 F (36.9 C) 98.3 F (36.8 C)   TempSrc: Oral Oral Oral   SpO2: 98% 93% 97%   Weight:   51 kg (112 lb 7 oz) 51 kg (112 lb 7 oz)  Height:        Intake/Output Summary (Last 24 hours) at 12/01/16 1450 Last data filed at 12/01/16 1001  Gross per 24 hour  Intake          1663.33 ml  Output  0 ml  Net          1663.33 ml   Filed Weights   11/28/16 0451 12/01/16 0516 12/01/16 0750  Weight: 49.5 kg (109 lb 2 oz) 51 kg (112 lb 7 oz) 51 kg (112 lb 7 oz)     Physical Examination:   Physical Exam: Eyes: No icterus, extraocular muscles intact  Mouth: Oral mucosa is moist, no lesions on palate,  Neck: Supple, no deformities, masses, or tenderness Lungs: Normal respiratory effort, bilateral clear to auscultation, no crackles or wheezes.  Heart: Regular rate and rhythm, S1 and S2 normal, no murmurs, rubs auscultated Abdomen: BS normoactive,soft,nondistended,non-tender to palpation,no  organomegaly Extremities: No pretibial edema, no erythema, no cyanosis, no clubbing Neuro : Alert and oriented to time, place and person, No focal deficits  Skin: No rashes seen on exam      Data Reviewed: I have personally reviewed following labs and imaging studies  CBG: No results for input(s): GLUCAP in the last 168 hours.  CBC:  Recent Labs Lab 11/27/16 0412 11/28/16 0408 11/29/16 0356 11/30/16 0404 12/01/16 0407  WBC 11.3* 11.5* 14.9* 21.3* 18.2*  NEUTROABS  --   --  14.2*  --   --   HGB 9.6* 8.8* 9.1* 10.1* 8.7*  HCT 28.7* 26.5* 27.6* 29.9* 26.3*  MCV 86.7 88.0 88.7 86.9 89.5  PLT 365 329 371 237 342    Basic Metabolic Panel:  Recent Labs Lab 11/27/16 0412 11/28/16 0408 11/29/16 0356 11/30/16 0404 12/01/16 0407  NA 136 137 137 139 141  K 3.8 4.4 5.2* 4.3 4.1  CL 110 113* 113* 112* 113*  CO2 14* 14* 15* 13* 14*  GLUCOSE 123* 115* 126* 130* 144*  BUN 50* 40* 45* 57* 72*  CREATININE 1.88* 1.79* 1.92* 2.43* 2.43*  CALCIUM 9.1 9.0 9.0 9.2 8.9  MG  --  1.8  --   --   --     No results found for this or any previous visit (from the past 240 hour(s)).   Liver Function Tests:  Recent Labs Lab 11/27/16 0412 11/28/16 0408 11/29/16 0356 11/30/16 0404 12/01/16 0407  AST 472* 413* 574* 410* 193*  ALT 328* 292* 332* 313* 211*  ALKPHOS 780* 758* 767* 743* 523*  BILITOT 10.3* 11.4* 7.9* 8.5* 4.2*  PROT 6.4* 5.8* 5.8* 6.7 6.0*  ALBUMIN 2.8* 2.6* 2.4* 2.7* 2.5*    Recent Labs Lab 11/26/16 1133  LIPASE 213*    Recent Labs Lab 11/30/16 0404  AMMONIA 90*       Studies: Ir US Guide Bx Asp/drain  Result Date: 11/29/2016 INDICATION: Liver lesions EXAM: ULTRASOUND-GUIDED LIVER BIOPSY MEDICATIONS: None. ANESTHESIA/SEDATION: Moderate (conscious) sedation was employed during this procedure. A total of Versed 0.5 mg and Fentanyl 25 mcg was administered intravenously. Moderate Sedation Time: 10 minutes. The patient's level of consciousness and vital  signs were monitored continuously by radiology nursing throughout the procedure under my direct supervision. FLUOROSCOPY TIME:  Fluoroscopy Time:  minutes  seconds ( mGy). COMPLICATIONS: None immediate. PROCEDURE: Informed written consent was obtained from the patient after a thorough discussion of the procedural risks, benefits and alternatives. All questions were addressed. Maximal Sterile Barrier Technique was utilized including caps, mask, sterile gowns, sterile gloves, sterile drape, hand hygiene and skin antiseptic. A timeout was performed prior to the initiation of the procedure. The abdomen was prepped and draped in a sterile fashion. 1% lidocaine was utilized for local anesthesia. Under sonographic guidance, a 17 gauge needle was inserted into 8  lesion within the left lobe of the liver. Four 18 gauge core biopsies were obtained. Gel-Foam slurry was injected into the tract with needle placement. FINDINGS: Images document needle placement in the left lobe liver lesion. Post biopsy images demonstrate no evidence of hemorrhage. IMPRESSION: Successful ultrasound-guided core biopsy of a lesion in the left lobe of the liver. Electronically Signed   By: Marybelle Killings M.D.   On: 11/29/2016 15:41    Scheduled Meds: . dronabinol  2.5 mg Oral QAC lunch  . heparin subcutaneous  5,000 Units Subcutaneous Q8H      Time spent: 20 min  [facility] Pager [phone]. If 7PM-7AM, please contact night-coverage at [email or web address], Office  [phone]  password [place]  12/01/2016, 2:50 PM  LOS: 5 days

## 2016-12-01 NOTE — Progress Notes (Signed)
[name] 9:30 AM  Subjective: Patient doing okay today not quite is alert as yesterday but no new complaints and we discussed her conversation with the oncology team  Objective: Vital signs stable afebrile no acute distress abdomen is a little more firm and tender today than yesterday white count trending down hemoglobin slight drop liver tests decreasing  Assessment: Metastatic cancer with duct obstruction  Plan: Continue to follow liver tests I'm cautiously optimistic she will need any further drainage however before discharge will need a flat and upright x-ray to make sure her pancreatic stent has passed otherwise will need endoscopic removal in a week or 2  [facility] E  Pager [phone] After 5PM or if no answer call [phone]

## 2016-12-01 NOTE — Progress Notes (Addendum)
[facility]ospice and [facility]) Hospital Liaison:  RN visit  Notified by Alinda Sierras [facility], of patient/family request for [facility] services at home after discharge.  Chart and patient information under review by [facility] physician.  Hospice eligibility pending at this time.  Writer spoke with Corena Herter at bedside to initiate education related to hospice philosophy, services and team approach to care.  Patient/family verbalized understanding of information given.  Per discussion, plan is for discharge to home tomorrow by private vehicle.    Please send signed and completed DNR form home with patient/family.  Patient will need prescriptions for discharge comfort medications.   DME needs have been discussed, patient currently has the following equipment in the home:  Rollator.  Patient/family requests the following DME for delivery to the home:  Hospital bed, OBT, 3 in 1.  [facility] equipment manager has been notified and will contact [place] to arrange delivery to the home.  Home address has been verified and is correct in the chart.  Corena Herter, daughter, is the family member to contact to arrange time of delivery at [phone].    [facility] aware of the above.  Completed discharge summary will need to be faxed to [facility] at [phone] when final.  Please notify [facility] when patient is ready to leave the unit at discharge.  Call [phone] or [phone] after 5pm.  [facility] information and contact numbers given to [facility] during this visit.  Above information shared with Alinda Sierras [facility].   Please call with any hospice related questions.  Thank you for this referral.  Edyth Gunnels, RN, [facility] Liaison [phone]  All hospital liaisons are now on [place].

## 2016-12-02 ENCOUNTER — Inpatient Hospital Stay (HOSPITAL_COMMUNITY): Payer: Medicare HMO

## 2016-12-02 DIAGNOSIS — T85528A Displacement of other gastrointestinal prosthetic devices, implants and grafts, initial encounter: Secondary | ICD-10-CM

## 2016-12-02 LAB — COMPREHENSIVE METABOLIC PANEL
ALK PHOS: 444 U/L — AB (ref 38–126)
ALT: 168 U/L — AB (ref 14–54)
AST: 130 U/L — AB (ref 15–41)
Albumin: 2.6 g/dL — ABNORMAL LOW (ref 3.5–5.0)
Anion gap: 13 (ref 5–15)
BILIRUBIN TOTAL: 3.4 mg/dL — AB (ref 0.3–1.2)
BUN: 91 mg/dL — AB (ref 6–20)
CALCIUM: 8.9 mg/dL (ref 8.9–10.3)
CO2: 14 mmol/L — ABNORMAL LOW (ref 22–32)
CREATININE: 2.75 mg/dL — AB (ref 0.44–1.00)
Chloride: 113 mmol/L — ABNORMAL HIGH (ref 101–111)
GFR calc Af Amer: 18 mL/min — ABNORMAL LOW (ref >60)
GFR, EST NON AFRICAN AMERICAN: 15 mL/min — AB (ref >60)
Glucose, Bld: 100 mg/dL — ABNORMAL HIGH (ref 65–99)
POTASSIUM: 4.3 mmol/L (ref 3.5–5.1)
Sodium: 140 mmol/L (ref 135–145)
TOTAL PROTEIN: 6 g/dL — AB (ref 6.5–8.1)

## 2016-12-02 LAB — CBC
HCT: 26.9 % — ABNORMAL LOW (ref 36.0–46.0)
Hemoglobin: 8.8 g/dL — ABNORMAL LOW (ref 12.0–15.0)
MCH: 29.3 pg (ref 26.0–34.0)
MCHC: 32.7 g/dL (ref 30.0–36.0)
MCV: 89.7 fL (ref 78.0–100.0)
PLATELETS: 363 10*3/uL (ref 150–400)
RBC: 3 MIL/uL — ABNORMAL LOW (ref 3.87–5.11)
RDW: 18 % — AB (ref 11.5–15.5)
WBC: 22.7 10*3/uL — AB (ref 4.0–10.5)

## 2016-12-02 MED ORDER — OXYCODONE HCL 20 MG/ML PO CONC: 10.0000 mg | ORAL | 0 refills | Status: DC | PRN

## 2016-12-02 MED ORDER — OXYCODONE HCL 20 MG/ML PO CONC: 10.0000 mg | ORAL | 0 refills | Status: AC | PRN

## 2016-12-02 NOTE — Discharge Summary (Signed)
Physician Discharge Summary  [name] YBO:[id number] DOB: 04/15/1934 DOA: 11/26/2016  PCP: Patient, No Pcp Per  Admit date: 11/26/2016 Discharge date: 12/02/2016  Time spent: 35 minutes  Recommendations for Outpatient Follow-up:  1. Patient to be discharged  Home with home hospice 2. Follow up Dr Watt Climes as outpatient  Discharge Diagnoses:  Principal Problem:   Biliary obstruction due to malignant neoplasm [facility]) Active Problems:   Anemia in chronic kidney disease   Essential hypertension   Jaundice   Abdominal mass   Hepatic metastases ([place])   Counseling regarding goals of care   Palliative care by specialist   DNR (do not resuscitate)   Discharge Condition: Stable  Diet recommendation: regular diet  Filed Weights   12/01/16 0516 12/01/16 0750 12/02/16 0512  Weight: 51 kg (112 lb 7 oz) 51 kg (112 lb 7 oz) 50.4 kg (111 lb 1.8 oz)    History of present illness:  82 y.o.femalewith history of hypertension and chronic kidney disease and anemia who has not been to a doctor for many years presents to the ER with complaints of worsening abdominal pain. Patient states she has been doing abdominal pain for the last 2-3 months which has acutely worsened last 1 week. Patient also has been having nausea vomiting. Pain is mostly in the mid abdomen radiating to the back and chest. Denies any blood in the vomitus. Has had bowel movement last one today. Patient also noticed jaundice. Denies fever chills.Further workup suggests possible gallbladder malignancy.  Hospital Course:  1. 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  Patient underwent MRCP which showed dominant 8.3 cm infiltrative mass centered at the gallbladder neck which encases and occludes the biliary confluence in the porta hepatis and marked diffuse intrahepatic biliary ductal dilatation.  Differential includes cholangiocarcinoma or  primary gallbladder carcinoma.Underwent ERCP on 11/28/2016 again findings suggestive of gallbladder neck malignancy, 2 stents were deployed one in the CBD other in the pancreatic duct to relieve obstruction, she also has suspicious appearing spots in the liver noted on MRCP for which she is underwent CT-guided biopsy on 11/29/2016. At this time no aggressive intervention is sought and Dr Marin Olp recommends home with hospice. I discussed abdominal  xray results with Dr Therisa Doyne and she recommends that she can be discharged and she can follow up Dr Watt Climes as outpatient. 2. Hypertension- BP stable. Not on antihypertensive medications at home. 3. CKD stage III-creatinine 2.75 today. 4. Hyperkalemia-resolved.  Patient will be discharged home with home hospice  Procedures:  None   Consultations:  GI  Oncology  Discharge Exam: Vitals:   12/02/16 1027 12/02/16 1346  BP:  134/75  Pulse:  94  Resp:  18  Temp:  98.9 F (37.2 C)  SpO2: 94% 99%    General: Appears in no acute distress Cardiovascular: S1S2 RRR Respiratory: Clear bilaterally  Discharge Instructions   Discharge Instructions    Diet - low sodium heart healthy    Complete by:  As directed    Increase activity slowly    Complete by:  As directed      Current Discharge Medication List    START taking these medications   Details  oxyCODONE (ROXICODONE INTENSOL) 20 MG/ML concentrated solution Take 0.5 mLs (10 mg total) by mouth every 3 (three) hours as needed for severe pain. Qty: 30 mL, Refills: 0   Associated Diagnoses: Hepatic metastases ([place])      CONTINUE these medications which have NOT CHANGED  Details  acetaminophen (TYLENOL) 500 MG tablet Take 500 mg by mouth 2 (two) times daily.    Calcium-Magnesium-Vitamin D (CALCIUM 1200+D3 PO) Take 2 tablets by mouth daily.    co-enzyme Q-10 30 MG capsule Take 30 mg by mouth daily.    Garlic 315 MG CAPS Take 2 capsules by mouth daily.    glucosamine-chondroitin  500-400 MG tablet Take 2 tablets by mouth daily.    LECITHIN PO Take 1 capsule by mouth daily.    Multiple Vitamin (MULTIVITAMIN WITH MINERALS) TABS tablet Take 1 tablet by mouth daily.    thiamine (VITAMIN B-1) 100 MG tablet Take 100 mg by mouth daily.    vitamin E 200 UNIT capsule Take 200 Units by mouth daily.       Allergies  Allergen Reactions  . Airborne [Multiple Vitamins-Minerals]   . Amlodipine Besylate     REACTION: depression, disorientation, parasthesias, headache, dizziness, rash on leg:  All after one dose.  Marland Kitchen Amoxicillin   . Bactrim [Sulfamethoxazole-Trimethoprim]   . Benicar [Olmesartan]   . Benzonatate   . Coreg [Carvedilol]   . Diltiazem Hcl     REACTION: Sleepy, headache  . Endal Cd [Phenyleph-Chlorphen-Codeine]   . Fexofenadine   . Furosemide     REACTION: Leg pain, jerlky sensation  . Ibuprofen   . Lisinopril     REACTION: Very intolerant.  Was stopped per Dr. Hassell Done.  . Loratadine   . Losartan Potassium     REACTION: [place]  . Monurol [Fosfomycin Tromethamine]   . Omeprazole   . Orudis [Ketoprofen]   . Penicillins     Has patient had a PCN reaction causing immediate rash, facial/tongue/throat swelling, SOB or lightheadedness with hypotension: Unknown Has patient had a PCN reaction causing severe rash involving mucus membranes or skin necrosis: Unknown Has patient had a PCN reaction that required hospitalization: Unknown Has patient had a PCN reaction occurring within the last 10 years: Unknown If all of the above answers are "NO", then may proceed with Cephalosporin use.   . Ranitidine Hcl    Follow-up Information    [facility] Follow up.   Specialty:  Hospice and Palliative Medicine Contact information: [place] [address] [phone]            The results of significant diagnostics from this hospitalization (including imaging, microbiology, ancillary and laboratory) are listed below for reference.     Significant Diagnostic Studies: Ct Abdomen Pelvis Wo Contrast  Result Date: 11/26/2016 CLINICAL DATA:  Abdominal pain for a week. EXAM: CT ABDOMEN AND PELVIS WITHOUT CONTRAST TECHNIQUE: Multidetector CT imaging of the abdomen and pelvis was performed following the standard protocol without IV contrast. COMPARISON:  07/13/2009 pelvis CT. FINDINGS: Lower chest: Calcified granulomas in the right lower lobe. Small to moderate sliding hiatal hernia. Hepatobiliary: Severe intra and extrahepatic bile duct dilatation. There are superimposed subtle low-density masses throughout the liver.There is bulky masslike appearance at the hepatic hilum and upper gallbladder fossa where the gallbladder neck and the common bile duct are not visible. Cholelithiasis. The gallbladder is markedly dilated without inflammatory wall thickening. Pancreas: The pancreas appears displaced by the mass. No main duct dilatation to suggest primary pancreatic lesion. Spleen: Granulomatous calcifications. Adrenals/Urinary Tract: Negative adrenals. Atrophic kidneys with presumed cystic densities. No hydronephrosis. Unremarkable bladder. Stomach/Bowel:  No obstruction. No inflammatory changes. Vascular/Lymphatic: Soft tissue density at the hepatic hilum is likely both primary mass and adenopathy. No distant adenopathy in the deep liver drainage. Mild atherosclerotic calcification  for age. Reproductive:Hysterectomy Other: No ascites or pneumoperitoneum. Musculoskeletal: No acute abnormalities. Remote L3 and L4 compression fractures. Spinal degeneration and mild scoliosis. Osteopenia. IMPRESSION: Large mass at the hepatic hilum/gallbladder fossa, favor cholangiocarcinoma. There is severe bile duct and gallbladder obstruction. Multiple ill-defined low-density masses throughout the liver attributed to metastatic disease. Electronically Signed   By: Monte Fantasia M.D.   On: 11/26/2016 13:04   Mr Abdomen Mrcp Wo Contrast  Result Date:  11/28/2016 CLINICAL DATA:  82 year old female inpatient with jaundice, biliary obstruction and multiple hepatic masses on noncontrast CT. EXAM: MRI ABDOMEN WITHOUT CONTRAST  (INCLUDING MRCP) TECHNIQUE: Multiplanar multisequence MR imaging of the abdomen was performed. Heavily T2-weighted images of the biliary and pancreatic ducts were obtained, and three-dimensional MRCP images were rendered by post processing. COMPARISON:  11/26/2016 unenhanced CT abdomen/pelvis. FINDINGS: Lower chest: Mild hypoventilatory changes in the dependent lung bases. Hepatobiliary: The liver is enlarged by numerous (greater than 10) mildly T2 hyperintense similar-appearing masses scattered throughout the liver, with mild internal heterogeneity. Representative liver masses include a 4.8 x 3.4 cm segment 8 right liver lobe mass (series 8/ image 12) and a 7.0 x 4.5 cm segment 6 right liver lobe mass (series 8/ image 23). No hepatic steatosis. There is a dominant 8.3 x 5.2 cm infiltrative mass centered at the gallbladder neck (series 8/ image 30), which encases and narrows the gallbladder neck and which encases and occludes the biliary confluence of the right and left hepatic ducts and common hepatic duct. Marked diffuse intrahepatic biliary ductal dilatation. Gallbladder is prominently distended with layering gallstones measuring up to 2.7 cm in size. No definite gallbladder wall thickening or significant pericholecystic fluid. The distal half of the common bile duct is normal caliber (4 mm diameter). Pancreas: There are several small pancreatic cystic lesions scattered throughout the pancreas, largest 1.5 x 0.7 x 1.3 cm in the inferior distal pancreatic body (series 8/ image 25), most of which demonstrate lobulated outer contours and a few of which demonstrate thin internal septations, with no apparent solid components. No significant main pancreatic duct dilation. No pancreas divisum. Spleen: Normal size. No mass. Adrenals/Urinary Tract:  No discrete adrenal nodules. No hydronephrosis. Several simple appearing T2 hyperintense renal cysts in both kidneys, largest 1.8 cm in the interpolar right kidney. No overtly suspicious renal masses. Symmetric moderate to severe bilateral renal parenchymal atrophy. Stomach/Bowel: Moderate hiatal hernia. Otherwise normal nondistended stomach. Visualized small and large bowel is normal caliber, with no bowel wall thickening. Vascular/Lymphatic: Nonaneurysmal abdominal aorta. Preserved flow voids in the portal veins, noting extrinsic narrowing of the main portal vein by the dominant mass (series 8/ image 25). No pathologically enlarged lymph nodes in the abdomen. Other: No abdominal ascites or focal fluid collection. Musculoskeletal: No aggressive appearing focal osseous lesions. IMPRESSION: 1. Dominant 8.3 cm infiltrative mass centered at the gallbladder neck, which encases and occludes the biliary confluence in the porta hepatis with marked diffuse intrahepatic biliary ductal dilatation. Differential includes cholangiocarcinoma or primary gallbladder carcinoma. 2. Numerous similar-appearing mildly T2 hyperintense masses scattered throughout the liver, compatible with hepatic metastatic disease. 3. Extrinsic narrowing by the mass of the main portal vein, which appears remain patent . 4. Cholelithiasis. Marked gallbladder distention by the dominant mass. 5. Numerous small pancreatic cystic lesions, largest 1.5 cm in the distal pancreatic body, without overt high risk features on this noncontrast MRI study. 6. Moderate hiatal hernia. 7. Advanced symmetric renal atrophy. Electronically Signed   By: Ilona Sorrel M.D.   On: 11/28/2016 08:38  Mr 3d Recon At Scanner  Result Date: 11/28/2016 CLINICAL DATA:  82 year old female inpatient with jaundice, biliary obstruction and multiple hepatic masses on noncontrast CT. EXAM: MRI ABDOMEN WITHOUT CONTRAST  (INCLUDING MRCP) TECHNIQUE: Multiplanar multisequence MR imaging of  the abdomen was performed. Heavily T2-weighted images of the biliary and pancreatic ducts were obtained, and three-dimensional MRCP images were rendered by post processing. COMPARISON:  11/26/2016 unenhanced CT abdomen/pelvis. FINDINGS: Lower chest: Mild hypoventilatory changes in the dependent lung bases. Hepatobiliary: The liver is enlarged by numerous (greater than 10) mildly T2 hyperintense similar-appearing masses scattered throughout the liver, with mild internal heterogeneity. Representative liver masses include a 4.8 x 3.4 cm segment 8 right liver lobe mass (series 8/ image 12) and a 7.0 x 4.5 cm segment 6 right liver lobe mass (series 8/ image 23). No hepatic steatosis. There is a dominant 8.3 x 5.2 cm infiltrative mass centered at the gallbladder neck (series 8/ image 30), which encases and narrows the gallbladder neck and which encases and occludes the biliary confluence of the right and left hepatic ducts and common hepatic duct. Marked diffuse intrahepatic biliary ductal dilatation. Gallbladder is prominently distended with layering gallstones measuring up to 2.7 cm in size. No definite gallbladder wall thickening or significant pericholecystic fluid. The distal half of the common bile duct is normal caliber (4 mm diameter). Pancreas: There are several small pancreatic cystic lesions scattered throughout the pancreas, largest 1.5 x 0.7 x 1.3 cm in the inferior distal pancreatic body (series 8/ image 25), most of which demonstrate lobulated outer contours and a few of which demonstrate thin internal septations, with no apparent solid components. No significant main pancreatic duct dilation. No pancreas divisum. Spleen: Normal size. No mass. Adrenals/Urinary Tract: No discrete adrenal nodules. No hydronephrosis. Several simple appearing T2 hyperintense renal cysts in both kidneys, largest 1.8 cm in the interpolar right kidney. No overtly suspicious renal masses. Symmetric moderate to severe bilateral  renal parenchymal atrophy. Stomach/Bowel: Moderate hiatal hernia. Otherwise normal nondistended stomach. Visualized small and large bowel is normal caliber, with no bowel wall thickening. Vascular/Lymphatic: Nonaneurysmal abdominal aorta. Preserved flow voids in the portal veins, noting extrinsic narrowing of the main portal vein by the dominant mass (series 8/ image 25). No pathologically enlarged lymph nodes in the abdomen. Other: No abdominal ascites or focal fluid collection. Musculoskeletal: No aggressive appearing focal osseous lesions. IMPRESSION: 1. Dominant 8.3 cm infiltrative mass centered at the gallbladder neck, which encases and occludes the biliary confluence in the porta hepatis with marked diffuse intrahepatic biliary ductal dilatation. Differential includes cholangiocarcinoma or primary gallbladder carcinoma. 2. Numerous similar-appearing mildly T2 hyperintense masses scattered throughout the liver, compatible with hepatic metastatic disease. 3. Extrinsic narrowing by the mass of the main portal vein, which appears remain patent . 4. Cholelithiasis. Marked gallbladder distention by the dominant mass. 5. Numerous small pancreatic cystic lesions, largest 1.5 cm in the distal pancreatic body, without overt high risk features on this noncontrast MRI study. 6. Moderate hiatal hernia. 7. Advanced symmetric renal atrophy. Electronically Signed   By: Ilona Sorrel M.D.   On: 11/28/2016 08:38   Ir US Guide Bx Asp/drain  Result Date: 11/29/2016 INDICATION: Liver lesions EXAM: ULTRASOUND-GUIDED LIVER BIOPSY MEDICATIONS: None. ANESTHESIA/SEDATION: Moderate (conscious) sedation was employed during this procedure. A total of Versed 0.5 mg and Fentanyl 25 mcg was administered intravenously. Moderate Sedation Time: 10 minutes. The patient's level of consciousness and vital signs were monitored continuously by radiology nursing throughout the procedure under my direct supervision. FLUOROSCOPY  TIME:  Fluoroscopy  Time:  minutes  seconds ( mGy). COMPLICATIONS: None immediate. PROCEDURE: Informed written consent was obtained from the patient after a thorough discussion of the procedural risks, benefits and alternatives. All questions were addressed. Maximal Sterile Barrier Technique was utilized including caps, mask, sterile gowns, sterile gloves, sterile drape, hand hygiene and skin antiseptic. A timeout was performed prior to the initiation of the procedure. The abdomen was prepped and draped in a sterile fashion. 1% lidocaine was utilized for local anesthesia. Under sonographic guidance, a 17 gauge needle was inserted into 8 lesion within the left lobe of the liver. Four 18 gauge core biopsies were obtained. Gel-Foam slurry was injected into the tract with needle placement. FINDINGS: Images document needle placement in the left lobe liver lesion. Post biopsy images demonstrate no evidence of hemorrhage. IMPRESSION: Successful ultrasound-guided core biopsy of a lesion in the left lobe of the liver. Electronically Signed   By: Marybelle Killings M.D.   On: 11/29/2016 15:41   Dg Ercp Biliary & Pancreatic Ducts  Result Date: 11/28/2016 CLINICAL DATA:  Biliary obstruction EXAM: ERCP TECHNIQUE: Multiple spot images obtained with the fluoroscopic device and submitted for interpretation post-procedure. FLUOROSCOPY TIME:  Fluoroscopy Time:  20 minutes and 46 seconds Radiation Exposure Index (if provided by the fluoroscopic device): Not given Number of Acquired Spot Images: 20 COMPARISON:  None. FINDINGS: Contrast fills the biliary tree. Balloon stone retrieval is documented. A stent has been placed across the mid biliary tree and porta hepatis. IMPRESSION: See above. These images were submitted for radiologic interpretation only. Please see the procedural report for the amount of contrast and the fluoroscopy time utilized. Electronically Signed   By: Marybelle Killings M.D.   On: 11/28/2016 13:47   Dg Abd 2 Views  Result Date:  12/02/2016 CLINICAL DATA:  Assess pancreatic stent position. EXAM: ABDOMEN - 2 VIEW COMPARISON:  02/08/3233 FINDINGS: Metallic biliary stent is in place in appears to be in good and stable position when compared to the cholangiogram and stent placement spot films. There is a plastic pancreatic duct stent which appears to be in the second portion of the duodenum just below the biliary stent. The bowel gas pattern is unremarkable. Moderate stool in the colon. No free air. IMPRESSION: 1. Metallic biliary stent in good and stable position without complicating features. 2. The plastic pancreatic duct stent appears to be in the second portion the duodenum just below the biliary stent. Electronically Signed   By: Marijo Sanes M.D.   On: 12/02/2016 12:27    Microbiology: No results found for this or any previous visit (from the past 240 hour(s)).   Labs: Basic Metabolic Panel:  Recent Labs Lab 11/28/16 0408 11/29/16 0356 11/30/16 0404 12/01/16 0407 12/02/16 0359  NA 137 137 139 141 140  K 4.4 5.2* 4.3 4.1 4.3  CL 113* 113* 112* 113* 113*  CO2 14* 15* 13* 14* 14*  GLUCOSE 115* 126* 130* 144* 100*  BUN 40* 45* 57* 72* 91*  CREATININE 1.79* 1.92* 2.43* 2.43* 2.75*  CALCIUM 9.0 9.0 9.2 8.9 8.9  MG 1.8  --   --   --   --    Liver Function Tests:  Recent Labs Lab 11/28/16 0408 11/29/16 0356 11/30/16 0404 12/01/16 0407 12/02/16 0359  AST 413* 574* 410* 193* 130*  ALT 292* 332* 313* 211* 168*  ALKPHOS 758* 767* 743* 523* 444*  BILITOT 11.4* 7.9* 8.5* 4.2* 3.4*  PROT 5.8* 5.8* 6.7 6.0* 6.0*  ALBUMIN 2.6*  2.4* 2.7* 2.5* 2.6*    Recent Labs Lab 11/26/16 1133  LIPASE 213*    Recent Labs Lab 11/30/16 0404  AMMONIA 90*   CBC:  Recent Labs Lab 11/28/16 0408 11/29/16 0356 11/30/16 0404 12/01/16 0407 12/02/16 0359  WBC 11.5* 14.9* 21.3* 18.2* 22.7*  NEUTROABS  --  14.2*  --   --   --   HGB 8.8* 9.1* 10.1* 8.7* 8.8*  HCT 26.5* 27.6* 29.9* 26.3* 26.9*  MCV 88.0 88.7 86.9 89.5  89.7  PLT 329 371 237 344 363    CBG:   Signed:  Laiya Wisby S MD.  [facility] 12/02/2016, 1:54 PM

## 2016-12-02 NOTE — Progress Notes (Signed)
Subjective: The patient was seen and examined at bedside. She is tolerating solids and denies abdominal pain, nausea or vomiting.  Objective: Vital signs in last 24 hours: Temp:  [97.5 F (36.4 C)-97.6 F (36.4 C)] 97.6 F (36.4 C) (11/02 0512) Pulse Rate:  [73-85] 85 (11/02 0512) Resp:  [16] 16 (11/02 0512) BP: (133-148)/(60-68) 148/68 (11/02 0512) SpO2:  [96 %-98 %] 98 % (11/02 0512) Weight:  [50.4 kg (111 lb 1.8 oz)] 50.4 kg (111 lb 1.8 oz) (11/02 0512) Weight change: 0 kg (0 lb) Last BM Date: 12/01/16  PE: Appears comfortable GENERAL: Mild pallor, mild icterus ABDOMEN: Soft, nondistended, normoactive bowel sounds EXTREMITIES: No edema, no deformity  Lab Results: Results for orders placed or performed during the hospital encounter of 11/26/16 (from the past 48 hour(s))  Comprehensive metabolic panel     Status: Abnormal   Collection Time: 12/01/16  4:07 AM  Result Value Ref Range   Sodium 141 135 - 145 mmol/L   Potassium 4.1 3.5 - 5.1 mmol/L   Chloride 113 (H) 101 - 111 mmol/L   CO2 14 (L) 22 - 32 mmol/L   Glucose, Bld 144 (H) 65 - 99 mg/dL   BUN 72 (H) 6 - 20 mg/dL   Creatinine, Ser 2.43 (H) 0.44 - 1.00 mg/dL   Calcium 8.9 8.9 - 10.3 mg/dL   Total Protein 6.0 (L) 6.5 - 8.1 g/dL   Albumin 2.5 (L) 3.5 - 5.0 g/dL   AST 193 (H) 15 - 41 U/L   ALT 211 (H) 14 - 54 U/L   Alkaline Phosphatase 523 (H) 38 - 126 U/L   Total Bilirubin 4.2 (H) 0.3 - 1.2 mg/dL    Comment: DELTA CHECK NOTED REPEATED TO VERIFY    GFR calc non Af Amer 18 (L) >60 mL/min   GFR calc Af Amer 21 (L) >60 mL/min    Comment: (NOTE) The eGFR has been calculated using the CKD EPI equation. This calculation has not been validated in all clinical situations. eGFR's persistently <60 mL/min signify possible Chronic Kidney Disease.    Anion gap 14 5 - 15  CBC     Status: Abnormal   Collection Time: 12/01/16  4:07 AM  Result Value Ref Range   WBC 18.2 (H) 4.0 - 10.5 K/uL   RBC 2.94 (L) 3.87 - 5.11 MIL/uL    Hemoglobin 8.7 (L) 12.0 - 15.0 g/dL   HCT 26.3 (L) 36.0 - 46.0 %   MCV 89.5 78.0 - 100.0 fL   MCH 29.6 26.0 - 34.0 pg   MCHC 33.1 30.0 - 36.0 g/dL   RDW 17.7 (H) 11.5 - 15.5 %   Platelets 344 150 - 400 K/uL  Comprehensive metabolic panel     Status: Abnormal   Collection Time: 12/02/16  3:59 AM  Result Value Ref Range   Sodium 140 135 - 145 mmol/L   Potassium 4.3 3.5 - 5.1 mmol/L   Chloride 113 (H) 101 - 111 mmol/L   CO2 14 (L) 22 - 32 mmol/L   Glucose, Bld 100 (H) 65 - 99 mg/dL   BUN 91 (H) 6 - 20 mg/dL   Creatinine, Ser 2.75 (H) 0.44 - 1.00 mg/dL   Calcium 8.9 8.9 - 10.3 mg/dL   Total Protein 6.0 (L) 6.5 - 8.1 g/dL   Albumin 2.6 (L) 3.5 - 5.0 g/dL   AST 130 (H) 15 - 41 U/L   ALT 168 (H) 14 - 54 U/L   Alkaline Phosphatase 444 (H) 38 -  126 U/L   Total Bilirubin 3.4 (H) 0.3 - 1.2 mg/dL   GFR calc non Af Amer 15 (L) >60 mL/min   GFR calc Af Amer 18 (L) >60 mL/min    Comment: (NOTE) The eGFR has been calculated using the CKD EPI equation. This calculation has not been validated in all clinical situations. eGFR's persistently <60 mL/min signify possible Chronic Kidney Disease.    Anion gap 13 5 - 15  CBC     Status: Abnormal   Collection Time: 12/02/16  3:59 AM  Result Value Ref Range   WBC 22.7 (H) 4.0 - 10.5 K/uL   RBC 3.00 (L) 3.87 - 5.11 MIL/uL   Hemoglobin 8.8 (L) 12.0 - 15.0 g/dL   HCT 26.9 (L) 36.0 - 46.0 %   MCV 89.7 78.0 - 100.0 fL   MCH 29.3 26.0 - 34.0 pg   MCHC 32.7 30.0 - 36.0 g/dL   RDW 18.0 (H) 11.5 - 15.5 %   Platelets 363 150 - 400 K/uL    Studies/Results: No results found.  Medications: I have reviewed the patient's current medications.  Assessment: 1. 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 ERCP with placement of a metallic stent, plastic pancreatic stent placed during the procedure 3. Renal impairment, leukocytosis, normocytic anemia  Plan: 1. Reviewed notes  from oncology, plan is to discharge patient for home hospice today. No further treatment planned. 2. Improvement in LFTs. 3. Plan to get an abdominal x-ray before discharge today, to verify that pancreatic stent has fallen off spontaneously.  Ronnette Juniper 12/02/2016, 8:08 AM   Pager [phone] If no answer or after 5 PM call [phone]

## 2016-12-02 NOTE — Progress Notes (Signed)
Ms. Brillhart continues to weaken. I can see how she is weakened over this week. I don't think she is eating all that much.  It sounds like she might be going home today.  She is not in pain.  Hospice will be following her at home. I am grateful for their help. I am thankful for the excellent response from case management.  Her labs show that her bilirubin continues to improve. It is now down to 3.4. Her SGPT is 168 SGOT 130. Alkaline phosphatase is 444. Her creatinine continues to rise. It is now 2.75.  Her white cell count is elevated. I'm unsure what might be going on there. I don't think there is any obvious cholangitis. She's not having any abdominal pain. There is no nausea or vomiting. She's having no fever.  She did get iron earlier this week. Her hemoglobin today is 8.8. I don't think we have to transfuse her.  Hopefully, she will be able to stay home until she passes. Given her decline, I would be incredibly surprised if she made it through this month. Hopefully, she will make it to Thanksgiving.  On her physical exam, her vital signs show temperature of 97.6. Pulse 85. Blood pressure 148/68. Her head and neck exam shows no scleral icterus. She has no adenopathy in the neck. Lungs are clear bilaterally. Cardiac exam regular rate and rhythm. Abdomen is soft. There is some slight discomfort with palpation in the right upper quadrant. There is no fluid wave. Bowel sounds are present. Extremities shows some muscle atrophy in her upper and lower extremities. Neurological exam shows no focal neurological deficits.  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 Her CA 19-9 is incredibly elevated.  For right now, we will see how things go at home. I will try to get her into the office in a week or so just to see how she is doing and to check her labs.  Our goal clearly is comfort. This is on top priority.  Lattie Haw, MD  Oswaldo Milian 41:10

## 2016-12-31 DEATH — deceased
